# Patient Record
Sex: Female | Born: 2000 | Hispanic: Yes | Marital: Married | Smoking: Never smoker
Health system: Southern US, Community
[De-identification: ages and names within clinical notes are randomized; demographics above are authoritative.]

## PROBLEM LIST (undated history)

## (undated) DIAGNOSIS — J45909 Unspecified asthma, uncomplicated: Secondary | ICD-10-CM

## (undated) DIAGNOSIS — Z9109 Other allergy status, other than to drugs and biological substances: Secondary | ICD-10-CM

## (undated) DIAGNOSIS — F332 Major depressive disorder, recurrent severe without psychotic features: Secondary | ICD-10-CM

## (undated) HISTORY — PX: NO PAST SURGERIES: SHX2092

---
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
## 2020-01-19 ENCOUNTER — Other Ambulatory Visit: Payer: Self-pay

## 2020-01-19 ENCOUNTER — Ambulatory Visit (HOSPITAL_COMMUNITY)
Admission: EM | Admit: 2020-01-19 | Discharge: 2020-01-19 | Disposition: A | Discharge status: Home / Self Care | Payer: Medicaid Other | Attending: Family Medicine | Admitting: Family Medicine

## 2020-01-19 DIAGNOSIS — R42 Dizziness and giddiness: Secondary | ICD-10-CM | POA: Diagnosis not present

## 2020-01-19 LAB — COMPREHENSIVE METABOLIC PANEL
ALT: 37 U/L (ref 0–44)
AST: 36 U/L (ref 15–41)
Albumin: 3.7 g/dL (ref 3.5–5.0)
Alkaline Phosphatase: 64 U/L (ref 38–126)
Anion gap: 9 (ref 5–15)
BUN: 9 mg/dL (ref 6–20)
CO2: 27 mmol/L (ref 22–32)
Calcium: 9.1 mg/dL (ref 8.9–10.3)
Chloride: 101 mmol/L (ref 98–111)
Creatinine, Ser: 0.66 mg/dL (ref 0.44–1.00)
GFR, Estimated: >60 mL/min (ref >60)
Glucose, Bld: 101 mg/dL — ABNORMAL HIGH (ref 70–99)
Potassium: 4.1 mmol/L (ref 3.5–5.1)
Sodium: 137 mmol/L (ref 135–145)
Total Bilirubin: 0.9 mg/dL (ref 0.3–1.2)
Total Protein: 6.8 g/dL (ref 6.5–8.1)

## 2020-01-19 LAB — TSH: TSH: 2.522 u[IU]/mL (ref 0.350–4.500)

## 2020-01-19 LAB — POCT URINALYSIS DIPSTICK, ED / UC
Bilirubin Urine: NEGATIVE
Glucose, UA: NEGATIVE mg/dL
Hgb urine dipstick: NEGATIVE
Ketones, ur: NEGATIVE mg/dL
Leukocytes,Ua: NEGATIVE
Nitrite: NEGATIVE
Protein, ur: NEGATIVE mg/dL
Specific Gravity, Urine: 1.025 (ref 1.005–1.030)
Urobilinogen, UA: 0.2 mg/dL (ref 0.0–1.0)
pH: 7.5 (ref 5.0–8.0)

## 2020-01-19 LAB — CBC WITH DIFFERENTIAL/PLATELET
Abs Immature Granulocytes: 0 10*3/uL (ref 0.00–0.07)
Basophils Absolute: 0 10*3/uL (ref 0.0–0.1)
Basophils Relative: 0 %
Eosinophils Absolute: 0 10*3/uL (ref 0.0–0.5)
Eosinophils Relative: 0 %
HCT: 42.7 % (ref 36.0–46.0)
Hemoglobin: 14.2 g/dL (ref 12.0–15.0)
Lymphocytes Relative: 33 %
Lymphs Abs: 1.7 10*3/uL (ref 0.7–4.0)
MCH: 29.5 pg (ref 26.0–34.0)
MCHC: 33.3 g/dL (ref 30.0–36.0)
MCV: 88.8 fL (ref 80.0–100.0)
Monocytes Absolute: 0.5 10*3/uL (ref 0.1–1.0)
Monocytes Relative: 9 %
Neutro Abs: 2.9 10*3/uL (ref 1.7–7.7)
Neutrophils Relative %: 58 %
Platelets: 143 10*3/uL — ABNORMAL LOW (ref 150–400)
RBC: 4.81 MIL/uL (ref 3.87–5.11)
RDW: 12.4 % (ref 11.5–15.5)
WBC: 5 10*3/uL (ref 4.0–10.5)
nRBC: 0 % (ref 0.0–0.2)
nRBC: 0 /100 WBC

## 2020-01-19 LAB — POC URINE PREG, ED: Preg Test, Ur: NEGATIVE

## 2020-01-19 NOTE — ED Triage Notes (Signed)
Pt presents with ongoing dizziness, headache, and fatigue today.

## 2020-01-19 NOTE — Discharge Instructions (Signed)
Follow up with Primary Care as soon as able to re-check your symptoms. Make sure to drink plenty of fluids and eat a health diet

## 2020-01-20 NOTE — ED Provider Notes (Signed)
[facility]    CSN: [id number] Arrival date & time: 01/19/20  1834      History   Chief Complaint Chief Complaint  Patient presents with  . Dizziness  . Headache  . Fatigue    HPI Robyn Ball is a 19 y.o. female.   Here today with about 2 weeks of acute on chronic positional dizziness as well as some intermittent fatigue and headaches that seem to sometimes be related to the dizziness. She states she's been dealing with this issue for several years now and has seen Cardiology and PCP for it in the past without abnormal findings other than orthostasis. States when she bends down or gets up too quickly this seems to trigger the dizziness which lasts a few minutes. Sometimes does come with nausea but not every time. No room spinning, CP, SOB, palpitations, syncope. Does feel it may be partly related to her poor appetite and intake lately. She has not been hydrating or eating much as she states she feels full all the time. Does not try anything OTC to help with sxs.      Past Medical History:  Diagnosis Date  . Asthma   . Environmental allergies   . 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 Suicidal ideations 06/07/2015  . Acute respiratory failure, unspecified whether with hypoxia or hypercapnia (HCC) 04/19/2014  . Status asthmaticus 04/18/2014    No past surgical history on file.  OB History   No obstetric history on file.      Home Medications    Prior to Admission medications   Medication Sig Start Date End Date Taking? Authorizing Provider  albuterol (PROVENTIL HFA;VENTOLIN HFA) 108 (90 BASE) MCG/ACT inhaler Inhale 4 puffs into the lungs every 4 (four) hours as needed for wheezing or shortness of breath. 04/22/14   Ardith Dark, MD  albuterol (PROVENTIL  HFA;VENTOLIN HFA) 108 (90 Base) MCG/ACT inhaler Inhale 2 puffs into the lungs every 4 (four) hours as needed for wheezing or shortness of breath. 03/17/17   Little, Ambrose Finland, MD  beclomethasone (QVAR) 80 MCG/ACT inhaler Inhale 1 puff into the lungs 2 (two) times daily. Patient not taking: Reported on 04/26/2015 04/22/14   Ardith Dark, MD  ibuprofen (ADVIL,MOTRIN) 600 MG tablet Take 1 tablet (600 mg total) by mouth every 6 (six) hours as needed. 03/09/17   Ree Shay, MD  loratadine (CLARITIN) 10 MG tablet Take 1 tablet (10 mg total) by mouth daily. 04/27/15   Antony Madura, PA-C  ondansetron (ZOFRAN ODT) 4 MG disintegrating tablet Take 1 tablet (4 mg total) by mouth every 8 (eight) hours as needed for nausea or vomiting. 03/17/17   Little, Ambrose Finland, MD  oseltamivir (TAMIFLU) 75 MG capsule Take 1 capsule (75 mg total) by mouth every 12 (twelve) hours. 03/17/17   Little, Ambrose Finland, MD    Family History No family history on file.  Social History Social History   Tobacco Use  . Smoking status: Never Smoker  . Smokeless tobacco: Never Used  Substance Use Topics  . Alcohol use: No  . Drug use: No     Allergies   Patient has no known allergies.   Review of Systems Review of Systems PER HPI    Physical Exam Triage Vital Signs ED Triage Vitals  Enc Vitals Group  BP 01/19/20 1902 113/73     Pulse Rate 01/19/20 1902 82     Resp 01/19/20 1902 18     Temp 01/19/20 1902 99.2 F (37.3 C)     Temp Source 01/19/20 1902 Oral     SpO2 01/19/20 1902 100 %     Weight --      Height --      Head Circumference --      Peak Flow --      Pain Score 01/19/20 1900 4     Pain Loc --      Pain Edu? --      Excl. in GC? --    No data found.  Updated Vital Signs BP 113/73 (BP Location: Right Arm)   Pulse 82   Temp 99.2 F (37.3 C) (Oral)   Resp 18   LMP 12/01/2019 Comment: Pt states she took a plan b and that was her last normal period  SpO2 100%   Visual Acuity Right Eye  Distance:   Left Eye Distance:   Bilateral Distance:    Right Eye Near:   Left Eye Near:    Bilateral Near:     Physical Exam Vitals and nursing note reviewed.  Constitutional:      Appearance: Normal appearance. She is not ill-appearing.  HENT:     Head: Atraumatic.     Right Ear: Tympanic membrane normal.     Left Ear: Tympanic membrane normal.     Nose: Nose normal.     Mouth/Throat:     Mouth: Mucous membranes are moist.     Pharynx: Oropharynx is clear.  Eyes:     Extraocular Movements: Extraocular movements intact.     Conjunctiva/sclera: Conjunctivae normal.  Cardiovascular:     Rate and Rhythm: Normal rate and regular rhythm.     Heart sounds: Normal heart sounds.  Pulmonary:     Effort: Pulmonary effort is normal.     Breath sounds: Normal breath sounds. No wheezing or rales.  Abdominal:     General: Bowel sounds are normal. There is no distension.     Palpations: Abdomen is soft.     Tenderness: There is no abdominal tenderness. There is no guarding.  Musculoskeletal:        General: Normal range of motion.     Cervical back: Normal range of motion and neck supple.  Skin:    General: Skin is warm and dry.  Neurological:     General: No focal deficit present.     Mental Status: She is alert and oriented to person, place, and time.     Cranial Nerves: No cranial nerve deficit.     Sensory: No sensory deficit.     Motor: No weakness.     Gait: Gait normal.  Psychiatric:        Mood and Affect: Mood normal.        Thought Content: Thought content normal.        Judgment: Judgment normal.      UC Treatments / Results  Labs (all labs ordered are listed, but only abnormal results are displayed) Labs Reviewed  CBC WITH DIFFERENTIAL/PLATELET - Abnormal; Notable for the following components:      Result Value   Platelets 143 (*)    All other components within normal limits  COMPREHENSIVE METABOLIC PANEL - Abnormal; Notable for the following components:    Glucose, Bld 101 (*)    All other components within normal limits  TSH  POCT URINALYSIS DIPSTICK, ED / UC  POC URINE PREG, ED    EKG   Radiology No results found.  Procedures Procedures (including critical care time)  Medications Ordered in UC Medications - No data to display  Initial Impression / Assessment and Plan / UC Course  I have reviewed the triage vital signs and the nursing notes.  Pertinent labs & imaging results that were available during my care of the patient were reviewed by me and considered in my medical decision making (see chart for details).     U/A, urine preg, vitals, EKG (stable from previous done at Cardiology, sinus arrhythmia without any acute ST T wave changes) exam all benign and reassuring today. Will check some basic bloodwork to rule out thyroid condition, anemia, electrolyte abnormalities. ?POTS given her history and presentation and discussed close PCP f/u and possibly referral to Neurology for further evaluation. Discussed proper diet and hydration additionally. Strict return precautions reviewed.   Final Clinical Impressions(s) / UC Diagnoses   Final diagnoses:  Dizziness     Discharge Instructions     Follow up with Primary Care as soon as able to re-check your symptoms. Make sure to drink plenty of fluids and eat a health diet    ED Prescriptions    None     PDMP not reviewed this encounter.   Particia Nearing, [place] 01/20/20 1113

## 2021-01-02 ENCOUNTER — Inpatient Hospital Stay (HOSPITAL_COMMUNITY)
Admission: AD | Admit: 2021-01-02 | Discharge: 2021-01-02 | Disposition: A | Discharge status: Home / Self Care | Payer: Medicaid Other | Attending: Obstetrics and Gynecology | Admitting: Obstetrics and Gynecology

## 2021-01-02 ENCOUNTER — Other Ambulatory Visit: Payer: Self-pay

## 2021-01-02 ENCOUNTER — Encounter (HOSPITAL_COMMUNITY): Payer: Self-pay

## 2021-01-02 DIAGNOSIS — O26891 Other specified pregnancy related conditions, first trimester: Secondary | ICD-10-CM | POA: Diagnosis not present

## 2021-01-02 DIAGNOSIS — Z3A12 12 weeks gestation of pregnancy: Secondary | ICD-10-CM | POA: Insufficient documentation

## 2021-01-02 DIAGNOSIS — R109 Unspecified abdominal pain: Secondary | ICD-10-CM | POA: Insufficient documentation

## 2021-01-02 DIAGNOSIS — O26899 Other specified pregnancy related conditions, unspecified trimester: Secondary | ICD-10-CM

## 2021-01-02 LAB — URINALYSIS, ROUTINE W REFLEX MICROSCOPIC
Bilirubin Urine: NEGATIVE
Glucose, UA: NEGATIVE mg/dL
Hgb urine dipstick: NEGATIVE
Ketones, ur: 80 mg/dL — AB
Leukocytes,Ua: NEGATIVE
Nitrite: NEGATIVE
Protein, ur: NEGATIVE mg/dL
Specific Gravity, Urine: 1.028 (ref 1.005–1.030)
pH: 5 (ref 5.0–8.0)

## 2021-01-02 LAB — POC URINE PREG, ED: Preg Test, Ur: POSITIVE — AB

## 2021-01-02 NOTE — MAU Provider Note (Signed)
History     CSN: [id number]  Arrival date and time: 01/02/21 1247   Event Date/Time   First Provider Initiated Contact with Patient 01/02/21 1617      Chief Complaint  Patient presents with   Abdominal Pain   HPI Blyss Aguilar-Varela is a 20 y.o. G1P0 at [redacted]w[redacted]d who presents with abdominal pain. Symptoms started 2 days ago. Reports pain throughout the left side of her abdomen. Pain is intermittent & occurs several times per day. Lasts for about 5 minutes at a time. Rates pain 5/10. Hasn't treated symptoms. Endorses nausea. Denies fever, diarrhea, constipation, dysuria, hematuria, flank pain, vaginal bleeding, or vaginal discharge. Goes to [employer] - had her new ob appointment last week.   OB History     Gravida  1   Para      Term      Preterm      AB      Living         SAB      IAB      Ectopic      Multiple      Live Births              Past Medical History:  Diagnosis Date   Asthma    Environmental allergies    MDD (major depressive disorder), recurrent severe, without psychosis ([facility]) 06/08/2015    Past Surgical History:  Procedure Laterality Date   NO PAST SURGERIES      History reviewed. No pertinent family history.  Social History   Tobacco Use   Smoking status: Never   Smokeless tobacco: Never  Vaping Use   Vaping Use: Never used  Substance Use Topics   Alcohol use: No   Drug use: No    Allergies:  Allergies  Allergen Reactions   Pollen Extract     Medications Prior to Admission  Medication Sig Dispense Refill Last Dose   Prenatal Vit-Fe Fumarate-FA (PREPLUS) 27-1 MG TABS Take 1 tablet by mouth daily.   Past Week   promethazine (PHENERGAN) 25 MG tablet Take by mouth.      albuterol (PROVENTIL HFA;VENTOLIN HFA) 108 (90 BASE) MCG/ACT inhaler Inhale 4 puffs into the lungs every 4 (four) hours as needed for wheezing or shortness of breath. 2 Inhaler 2 Unknown   albuterol (PROVENTIL HFA;VENTOLIN HFA) 108 (90 Base) MCG/ACT  inhaler Inhale 2 puffs into the lungs every 4 (four) hours as needed for wheezing or shortness of breath. 1 Inhaler 0 Unknown    Review of Systems Physical Exam   Blood pressure 108/73, pulse 61, temperature 98 F (36.7 C), temperature source Oral, resp. rate 16, weight 66 kg, last menstrual period 09/10/2020, SpO2 98 %.  Physical Exam Vitals and nursing note reviewed. Exam conducted with a chaperone present.  Constitutional:      Appearance: She is well-developed. She is not ill-appearing.  HENT:     Head: Normocephalic and atraumatic.  Eyes:     General: No scleral icterus. Pulmonary:     Effort: Pulmonary effort is normal. No respiratory distress.  Abdominal:     General: Abdomen is flat.     Palpations: Abdomen is soft.     Tenderness: There is no abdominal tenderness. There is no right CVA tenderness or left CVA tenderness.  Genitourinary:    Comments: NEFG. No blood. Cervix closed/thick Skin:    General: Skin is warm and dry.  Neurological:     General: No focal deficit present.  Mental Status: She is alert.  Psychiatric:        Mood and Affect: Mood normal.        Behavior: Behavior normal.    MAU Course  Procedures Results for orders placed or performed during the hospital encounter of 01/02/21 (from the past 24 hour(s))  POC urine preg, ED (not at [facility])     Status: Abnormal   Collection Time: 01/02/21  2:30 PM  Result Value Ref Range   Preg Test, Ur POSITIVE (A) NEGATIVE  Urinalysis, Routine w reflex microscopic Urine, Clean Catch     Status: Abnormal   Collection Time: 01/02/21  3:17 PM  Result Value Ref Range   Color, Urine YELLOW YELLOW   APPearance HAZY (A) CLEAR   Specific Gravity, Urine 1.028 1.005 - 1.030   pH 5.0 5.0 - 8.0   Glucose, UA NEGATIVE NEGATIVE mg/dL   Hgb urine dipstick NEGATIVE NEGATIVE   Bilirubin Urine NEGATIVE NEGATIVE   Ketones, ur 80 (A) NEGATIVE mg/dL   Protein, ur NEGATIVE NEGATIVE mg/dL   Nitrite NEGATIVE NEGATIVE    Leukocytes,Ua NEGATIVE NEGATIVE    MDM Patient at [redacted]w[redacted]d who presents with left sided abdominal pain. Benign abdominal exam. Cervix closed/thick. No CVA tenderness. Pt afebrile. Currently in no pain. FHT present via doppler.   U/a shows no signs of infection.  Pt stable for discharge.  Assessment and Plan   1. Abdominal pain affecting pregnancy   2. [redacted] weeks gestation of pregnancy    -f/u with OB -reviewed reasons to return to MAU  Judeth Horn 01/02/2021, 5:38 PM

## 2021-01-02 NOTE — Discharge Instructions (Signed)
Return to care  If you have heavier bleeding that soaks through more than 2 pads per hour for an hour or more If you bleed so much that you feel like you might pass out or you do pass out If you have significant abdominal pain that is not improved with Tylenol   

## 2021-01-02 NOTE — ED Notes (Addendum)
Report called to [place], MAU Charge RN and transport notified of pt.

## 2021-01-02 NOTE — ED Triage Notes (Signed)
Patient is [redacted] weeks pregnant and had first OB visit last week. Complains of left sided abdominal pain with no associated symptoms x 3 days. Denies dysuria, denies bleeding, no discharge

## 2021-01-02 NOTE — ED Provider Notes (Signed)
Emergency Medicine Provider OB Triage Evaluation Note  Robyn Ball is a 20 y.o. female, No obstetric history on file., at Unknown gestation who presents to the emergency department with complaints of abd pain.  Review of  Systems  Positive: L side abd pain Negative: vaginal bleeding, vaginal discharge, fever  Physical Exam  BP 100/66 (BP Location: Left Arm)   Pulse 73   Temp 98.3 F (36.8 C)   Resp 16   SpO2 97%  General: Awake, no distress  HEENT: Atraumatic  Resp: Normal effort  Cardiac: Normal rate Abd: Nondistended, nontender  MSK: Moves all extremities without difficulty Neuro: Speech clear  Medical Decision Making  Pt evaluated for pregnancy concern and is stable for transfer to MAU. Pt is in agreement with plan for transfer.  2:27 PM Discussed with MAU APP, Rayfield Citizen, who accepts patient in transfer.  Clinical Impression  No diagnosis found.     Fayrene Helper, PA-C 01/02/21 1428    Terald Sleeper, MD 01/02/21 [phone]

## 2021-01-02 NOTE — MAU Note (Signed)
Pt reports to MAU with c/o abdominal pain on her left side that occurred this am.  Pt states that the pain has been off an on since about two nights ago.  Denies vaginal bleeding.

## 2021-02-06 DIAGNOSIS — Z9189 Other specified personal risk factors, not elsewhere classified: Secondary | ICD-10-CM

## 2021-02-24 ENCOUNTER — Other Ambulatory Visit: Payer: Self-pay | Admitting: Obstetrics and Gynecology

## 2021-02-24 DIAGNOSIS — Z3689 Encounter for other specified antenatal screening: Secondary | ICD-10-CM

## 2021-02-25 ENCOUNTER — Other Ambulatory Visit: Payer: Self-pay

## 2021-02-28 ENCOUNTER — Ambulatory Visit: Payer: Medicaid Other | Admitting: *Deleted

## 2021-02-28 ENCOUNTER — Ambulatory Visit: Payer: Medicaid Other | Attending: Obstetrics and Gynecology

## 2021-02-28 ENCOUNTER — Encounter: Payer: Self-pay | Admitting: *Deleted

## 2021-02-28 ENCOUNTER — Ambulatory Visit: Payer: Medicaid Other | Attending: Obstetrics and Gynecology | Admitting: Obstetrics and Gynecology

## 2021-02-28 ENCOUNTER — Other Ambulatory Visit: Payer: Self-pay | Admitting: Obstetrics and Gynecology

## 2021-02-28 ENCOUNTER — Other Ambulatory Visit: Payer: Self-pay

## 2021-02-28 VITALS — BP 101/56 | HR 71

## 2021-02-28 DIAGNOSIS — Z3689 Encounter for other specified antenatal screening: Secondary | ICD-10-CM

## 2021-02-28 DIAGNOSIS — O361120 Maternal care for Anti-A sensitization, second trimester, not applicable or unspecified: Secondary | ICD-10-CM

## 2021-02-28 DIAGNOSIS — Z363 Encounter for antenatal screening for malformations: Secondary | ICD-10-CM | POA: Diagnosis present

## 2021-02-28 DIAGNOSIS — Z3A20 20 weeks gestation of pregnancy: Secondary | ICD-10-CM | POA: Diagnosis not present

## 2021-02-28 NOTE — Progress Notes (Signed)
Maternal-Fetal Medicine   Name: Robyn Ball DOB: 2000-07-17 MRN: [id number] Referring Provider: Philip Aspen, MD  I had the pleasure of seeing [facility] at [facility]. She is G1 P0 at 20w 3d gestation and is here for a second-opinion ultrasound. At your office ultrasound performed on 02/24/21, bilateral pleural effusion (no hydrops) was seen.  On cell free fetal DNA screening, the risks of fetal aneuploidies are not increased.  MSAFP screening showed low risk for open neural tube defects. Patient does not give history of fever or rashes.  Past medical history: No history of diabetes or hypertension or any chronic medical conditions. She gives history of syphilis that was treated Past surgical history: Nil of note. Medications: Prenatal vitamins. Allergies: No known drug allergies. Social history: Denies tobacco or drug or alcohol use.  Family history: No history of venous thromboembolism in the family.  GYN history: No history of abnormal Pap smears or cervical surgeries.  Labs Blood type: O positive, RPR nonreactive, hemoglobin A1C 5%.  Ultrasound We performed fetal anatomical survey.  Fetal biometry is ahead of established gestational age by 1 week.  Amniotic fluid is normal good fetal activity seen. Findings include: -Bilateral large pleural effusions. -Minimal ascites is seen. -Scalp edema is present. Rest of the fetal anatomy including intracranial structures, kidneys, spine, bladder and skeletal survey appears normal. Cardiac anatomy could not be evaluated well.  However, cardiac axis and four-chamber view appear to be normal. Fetal heart rate and rhythm are normal. Placenta appears normal with no placentomegaly. Middle-cerebral artery Doppler showed normal peak-systolic velocity measurements (no evidence of fetal anemia). -Umbilical artery Doppler showed normal forward diastolic flow.  Our concerns include: Fetal hydrops -I  explained the diagnosis of fetal hydrops because of the presence of abnormal accumulation of fluid in serous cavities and the presence of scalp edema. -I discussed the possible causes including fetal hydrothorax, congenital heart disease (present in up to 20% of cases), fetal chromosomal malformations (Turner syndrome, Down syndrome, triploidy and trisomy 68), genetic syndromes, fetal infections (toxoplasmosis, CMV and Parvovirus).  -I explained the limitations of cell-free fetal DNA screening. -Other causes include chorioangioma (unlikely). MCA Doppler study is reassuring and rules out fetal anemia.  -Patient's blood type is O positive and antibody screen is negative. She is unlikely to have isoimmunization. -Fetomaternal hemorrhage can also lead to hydrops (no history of abdominal trauma). -Inborn errors of metabolism (1% to 2%) can lead to hydrops. -I recommended amniocentesis to rule out chromosomal anomalies and infections. Microarray analysis can be done to rule out some genetic conditions.  Amniotic fluid will be sent for CMV, toxoplasmosis and parvovirus PCR.  I explained the procedure and possible complication of miscarriage (1 and 500 procedures). I informed her that fetal loss is higher and is independent of the procedure performed. -Recommend fetal echocardiography by pediatric cardiologist.   Fetal hydrothorax -It can be primary, which is a defect in the lymphatic system (chylothorax postnatally) and is less common. It is a diagnosis of exclusion of other causes. -Severe pleural effusion can lead to pulmonary hypoplasia. -It is possible that hydrops complicated fetal hydrothorax.  Patient may benefit from thoracoamniotic shunt that should be considered in severe cases to reduce the likelihood of pulmonary hypoplasia and hydrops fetalis. Given the possibility of thoracoamniotic shunt placement, I recommended evaluation at the [facility]. Patient agreed to go to the [facility] for an  appointment.  I discussed with Dr. Barbarann Ehlers, who agreed to see her tomorrow or  in 2 days. MFM Division at the [facility] will contact your patient tomorrow with the appointment.  Recommendations -Amniocentesis/thoracentesis/shunt placement at [facility]. -Infectious work-up, chromosomal anomalies, microarray, Exome sequencing as needed. -We will follow-up as needed after evaluation at the [facility].  Thank you for consultation.  If you have any questions or concerns, please contact me the [facility].  Consultation including face-to-face (more than 50%) counseling 50 minutes.

## 2021-03-29 ENCOUNTER — Other Ambulatory Visit: Payer: Self-pay

## 2021-03-29 ENCOUNTER — Inpatient Hospital Stay (HOSPITAL_COMMUNITY)
Admission: EM | Admit: 2021-03-29 | Discharge: 2021-03-29 | Disposition: A | Discharge status: Home / Self Care | Payer: Medicaid Other | Attending: Obstetrics and Gynecology | Admitting: Obstetrics and Gynecology

## 2021-03-29 ENCOUNTER — Encounter (HOSPITAL_COMMUNITY): Payer: Self-pay | Admitting: *Deleted

## 2021-03-29 DIAGNOSIS — O26892 Other specified pregnancy related conditions, second trimester: Secondary | ICD-10-CM | POA: Insufficient documentation

## 2021-03-29 DIAGNOSIS — O99891 Other specified diseases and conditions complicating pregnancy: Secondary | ICD-10-CM

## 2021-03-29 DIAGNOSIS — Z20822 Contact with and (suspected) exposure to covid-19: Secondary | ICD-10-CM | POA: Diagnosis not present

## 2021-03-29 DIAGNOSIS — M549 Dorsalgia, unspecified: Secondary | ICD-10-CM | POA: Diagnosis not present

## 2021-03-29 DIAGNOSIS — R109 Unspecified abdominal pain: Secondary | ICD-10-CM | POA: Insufficient documentation

## 2021-03-29 DIAGNOSIS — M545 Low back pain, unspecified: Secondary | ICD-10-CM | POA: Diagnosis not present

## 2021-03-29 DIAGNOSIS — Y92410 Unspecified street and highway as the place of occurrence of the external cause: Secondary | ICD-10-CM | POA: Diagnosis not present

## 2021-03-29 DIAGNOSIS — Z3A24 24 weeks gestation of pregnancy: Secondary | ICD-10-CM | POA: Diagnosis not present

## 2021-03-29 DIAGNOSIS — R519 Headache, unspecified: Secondary | ICD-10-CM | POA: Diagnosis not present

## 2021-03-29 LAB — URINALYSIS, ROUTINE W REFLEX MICROSCOPIC
Bilirubin Urine: NEGATIVE
Glucose, UA: NEGATIVE mg/dL
Hgb urine dipstick: NEGATIVE
Ketones, ur: NEGATIVE mg/dL
Nitrite: NEGATIVE
Protein, ur: NEGATIVE mg/dL
Specific Gravity, Urine: 1.005 (ref 1.005–1.030)
pH: 7 (ref 5.0–8.0)

## 2021-03-29 LAB — CBC
HCT: 33.4 % — ABNORMAL LOW (ref 36.0–46.0)
Hemoglobin: 11.5 g/dL — ABNORMAL LOW (ref 12.0–15.0)
MCH: 31.7 pg (ref 26.0–34.0)
MCHC: 34.4 g/dL (ref 30.0–36.0)
MCV: 92 fL (ref 80.0–100.0)
Platelets: 222 10*3/uL (ref 150–400)
RBC: 3.63 MIL/uL — ABNORMAL LOW (ref 3.87–5.11)
RDW: 12.8 % (ref 11.5–15.5)
WBC: 11.2 10*3/uL — ABNORMAL HIGH (ref 4.0–10.5)
nRBC: 0 % (ref 0.0–0.2)

## 2021-03-29 LAB — COMPREHENSIVE METABOLIC PANEL
ALT: 14 U/L (ref 0–44)
AST: 28 U/L (ref 15–41)
Albumin: 3.1 g/dL — ABNORMAL LOW (ref 3.5–5.0)
Alkaline Phosphatase: 95 U/L (ref 38–126)
Anion gap: 11 (ref 5–15)
BUN: 5 mg/dL — ABNORMAL LOW (ref 6–20)
CO2: 19 mmol/L — ABNORMAL LOW (ref 22–32)
Calcium: 8.8 mg/dL — ABNORMAL LOW (ref 8.9–10.3)
Chloride: 105 mmol/L (ref 98–111)
Creatinine, Ser: 0.5 mg/dL (ref 0.44–1.00)
GFR, Estimated: >60 mL/min (ref >60)
Glucose, Bld: 96 mg/dL (ref 70–99)
Potassium: 3.6 mmol/L (ref 3.5–5.1)
Sodium: 135 mmol/L (ref 135–145)
Total Bilirubin: 0.8 mg/dL (ref 0.3–1.2)
Total Protein: 6.4 g/dL — ABNORMAL LOW (ref 6.5–8.1)

## 2021-03-29 LAB — PROTIME-INR
INR: 1 (ref 0.8–1.2)
Prothrombin Time: 12.7 seconds (ref 11.4–15.2)

## 2021-03-29 LAB — RESP PANEL BY RT-PCR (FLU A&B, COVID) ARPGX2
Influenza A by PCR: NEGATIVE
Influenza B by PCR: NEGATIVE
SARS Coronavirus 2 by RT PCR: NEGATIVE

## 2021-03-29 LAB — LACTIC ACID, PLASMA: Lactic Acid, Venous: 1.4 mmol/L (ref 0.5–1.9)

## 2021-03-29 MED ORDER — ACETAMINOPHEN 500 MG PO TABS
1000.0000 mg | ORAL_TABLET | Freq: Four times a day (QID) | ORAL | Status: DC | PRN
  Administered 2021-03-29: 1000 mg via ORAL
  Filled 2021-03-29: qty 2

## 2021-03-29 MED ORDER — ONDANSETRON HCL 4 MG/2ML IJ SOLN
4.0000 mg | Freq: Once | INTRAMUSCULAR | Status: AC
  Administered 2021-03-29: 4 mg via INTRAVENOUS
  Filled 2021-03-29: qty 2

## 2021-03-29 MED ORDER — CYCLOBENZAPRINE HCL 5 MG PO TABS
5.0000 mg | ORAL_TABLET | Freq: Three times a day (TID) | ORAL | 0 refills | Status: DC | PRN
Start: 2021-03-29 — End: 2023-06-07

## 2021-03-29 NOTE — ED Notes (Signed)
Report given to rapid OB RN, pt being transported to MAU via wheelchair

## 2021-03-29 NOTE — Progress Notes (Signed)
Report received from previous OB RR RN regarding patient status.  1911 Dr. Elly Modena contacted and made aware of patient in [place].  Will follow up with ED physician regarding medical clearance and contact Dr. Elly Modena back.  1912 Patient reports nausea, order received for zofran. Administered at 1923.  Fluids infusing at this time.  Patient endorses positive FM and audible on monitor. Abdomen soft and non-tender to touch.  Complaint of dull lower abdominal pain on left side.    Noelle.Marry  ED physician present at bedside. Notified that OB made aware.
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
## 2021-03-29 NOTE — Progress Notes (Signed)
Sharolyn Douglas, CNM notified of patient at 2011.  ED physician in contact with Dr. Elly Modena.   Tx of patient to MAU at 2101 via wheelchair without incidence.  Report given to MAU RN.
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
## 2021-04-21 ENCOUNTER — Other Ambulatory Visit: Payer: Self-pay

## 2021-04-21 ENCOUNTER — Inpatient Hospital Stay (HOSPITAL_COMMUNITY)
Admission: AD | Admit: 2021-04-21 | Discharge: 2021-04-21 | Disposition: A | Discharge status: Home / Self Care | Payer: Medicaid Other | Attending: Obstetrics and Gynecology | Admitting: Obstetrics and Gynecology

## 2021-04-21 ENCOUNTER — Encounter (HOSPITAL_COMMUNITY): Payer: Self-pay | Admitting: Obstetrics and Gynecology

## 2021-04-21 DIAGNOSIS — O368120 Decreased fetal movements, second trimester, not applicable or unspecified: Secondary | ICD-10-CM | POA: Diagnosis not present

## 2021-04-21 DIAGNOSIS — Z3689 Encounter for other specified antenatal screening: Secondary | ICD-10-CM | POA: Insufficient documentation

## 2021-04-21 DIAGNOSIS — M545 Low back pain, unspecified: Secondary | ICD-10-CM

## 2021-04-21 DIAGNOSIS — O26892 Other specified pregnancy related conditions, second trimester: Secondary | ICD-10-CM | POA: Insufficient documentation

## 2021-04-21 DIAGNOSIS — Z3A27 27 weeks gestation of pregnancy: Secondary | ICD-10-CM

## 2021-04-21 LAB — URINALYSIS, ROUTINE W REFLEX MICROSCOPIC
Bilirubin Urine: NEGATIVE
Glucose, UA: 50 mg/dL — AB
Hgb urine dipstick: NEGATIVE
Ketones, ur: NEGATIVE mg/dL
Nitrite: NEGATIVE
Protein, ur: NEGATIVE mg/dL
Specific Gravity, Urine: 1.021 (ref 1.005–1.030)
pH: 6 (ref 5.0–8.0)

## 2021-04-21 NOTE — MAU Provider Note (Signed)
?History  ?  ? ?CSN: [id number] ? ?Arrival date and time: 04/21/21 1233 ? ? Event Date/Time  ? First Provider Initiated Contact with Patient 04/21/21 1300   ?  ? ?Chief Complaint  ?Patient presents with  ? Back Pain  ? Decreased Fetal Movement  ? ?21 year old G1, P0 at 27.6 weeks presenting for decreased fetal movement and lower back pain.  Reports onset this a.m. around 7.  States back pain is midline and lumbar.  Rates pain 3 out of 10.  Has not treated. 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  Denies vaginal bleeding or loss of fluid. ? ? ?OB History   ? ? Gravida  ?1  ? Para  ?   ? Term  ?   ? Preterm  ?   ? AB  ?   ? Living  ?   ?  ? ? SAB  ?   ? IAB  ?   ? Ectopic  ?   ? Multiple  ?   ? Live Births  ?   ?   ?  ?  ? ? ?Past Medical History:  ?Diagnosis Date  ? Asthma   ? Environmental allergies   ? 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 ? ?Past Surgical History:  ?Procedure Laterality Date  ? NO PAST SURGERIES    ? ? ?Family History  ?Problem Relation Age of Onset  ? Diabetes Father   ? ? ?Social History  ? ?Tobacco Use  ? Smoking status: Never  ? Smokeless tobacco: Never  ?Vaping Use  ? Vaping Use: Never used  ?Substance Use Topics  ? Alcohol use: No  ? Drug use: No  ? ? ?Allergies:  ?Allergies  ?Allergen Reactions  ? Pollen Extract   ? ? ?Medications Prior to Admission  ?Medication Sig Dispense Refill Last Dose  ? Prenatal Vit-Fe Fumarate-FA (PREPLUS) 27-1 MG TABS Take 1 tablet by mouth daily.   04/20/2021  ? albuterol (PROVENTIL HFA;VENTOLIN HFA) 108 (90 BASE) MCG/ACT inhaler Inhale 4 puffs into the lungs every 4 (four) hours as needed for wheezing or shortness of breath. 2 Inhaler 2   ? albuterol (PROVENTIL HFA;VENTOLIN HFA) 108 (90 Base) MCG/ACT inhaler Inhale 2 puffs into the lungs every 4 (four) hours as needed for wheezing or shortness of breath. 1 Inhaler 0    ? cyclobenzaprine (FLEXERIL) 5 MG tablet Take 1 tablet (5 mg total) by mouth 3 (three) times daily as needed for muscle spasms. 30 tablet 0   ? promethazine (PHENERGAN) 25 MG tablet Take by mouth.     ? ? ?Review of Systems  ?Constitutional:  Negative for fever.  ?Gastrointestinal:  Negative for abdominal pain.  ?Genitourinary:  Negative for vaginal bleeding and vaginal discharge.  ?Musculoskeletal:  Positive for back pain.  ?Physical Exam  ? ?Blood pressure 107/62, pulse 87, temperature 97.9 ?F (36.6 ?C), temperature source Oral, resp. rate 14, last menstrual period 09/10/2020, SpO2 99 %. ? ?Physical Exam ?Vitals and nursing note reviewed.  ?Constitutional:   ?   General: She is not in acute distress. ?   Appearance: Normal appearance.  ?HENT:  ?   Head: Normocephalic and atraumatic.  ?Pulmonary:  ?   Effort: Pulmonary effort is normal. No respiratory distress.  ?Abdominal:  ?   Palpations: Abdomen is soft.  ?   Tenderness: There is no abdominal tenderness.  ?  Comments: Gravid ?  ?Musculoskeletal:     ?   General: Normal range of motion.  ?   Cervical back: Normal and normal range of motion.  ?   Thoracic back: Normal.  ?   Lumbar back: Normal.  ?     Back: ? ?Skin: ?   General: Skin is warm and dry.  ?Neurological:  ?   General: No focal deficit present.  ?   Mental Status: She is alert and oriented to person, place, and time.  ?Psychiatric:     ?   Mood and Affect: Mood normal.     ?   Behavior: Behavior normal.  ?EFM: 140 bpm, mod variability, + accels, rare variable decels ?Toco: rare UI ? ?Results for orders placed or performed during the hospital encounter of 04/21/21 (from the past 24 hour(s))  ?Urinalysis, Routine w reflex microscopic Urine, Clean Catch     Status: Abnormal  ? Collection Time: 04/21/21 12:42 PM  ?Result Value Ref Range  ? Color, Urine YELLOW YELLOW  ? APPearance HAZY (A) CLEAR  ? Specific Gravity, Urine 1.021 1.005 - 1.030  ? pH 6.0 5.0 - 8.0  ? Glucose, UA 50 (A) NEGATIVE mg/dL  ? Hgb  urine dipstick NEGATIVE NEGATIVE  ? Bilirubin Urine NEGATIVE NEGATIVE  ? Ketones, ur NEGATIVE NEGATIVE mg/dL  ? Protein, ur NEGATIVE NEGATIVE mg/dL  ? Nitrite NEGATIVE NEGATIVE  ? Leukocytes,Ua TRACE (A) NEGATIVE  ? RBC / HPF 0-5 0 - 5 RBC/hpf  ? WBC, UA 0-5 0 - 5 WBC/hpf  ? Bacteria, UA RARE (A) NONE SEEN  ? Squamous Epithelial / LPF 11-20 0 - 5  ? Mucus PRESENT   ? ? ?MAU Course  ?Procedures ? ?MDM ?NST reactive.  Marked greater than 20 fetal movements in the first hour.  Patient reports increased fetal activity.  Back pain greatly improved with heat packs.  Patient reassured and stable for discharge home. ? ?Assessment and Plan  ? ?1. [redacted] weeks gestation of pregnancy   ?2. NST (non-stress test) reactive   ?3. Acute midline low back pain without sciatica   ? ?Discharge home ?Follow-up at [facility] with Dr. Leward Quan ?Return precautions ?Tylenol and heat packs as needed ?Fetal movement counts ? ?Allergies as of 04/21/2021   ? ?   Reactions  ? Pollen Extract   ? ?  ? ?  ?Medication List  ?  ? ?TAKE these medications   ? ?albuterol 108 (90 Base) MCG/ACT inhaler ?Commonly known as: VENTOLIN HFA ?Inhale 4 puffs into the lungs every 4 (four) hours as needed for wheezing or shortness of breath. ?  ?albuterol 108 (90 Base) MCG/ACT inhaler ?Commonly known as: VENTOLIN HFA ?Inhale 2 puffs into the lungs every 4 (four) hours as needed for wheezing or shortness of breath. ?  ?cyclobenzaprine 5 MG tablet ?Commonly known as: FLEXERIL ?Take 1 tablet (5 mg total) by mouth 3 (three) times daily as needed for muscle spasms. ?  ?PrePLUS 27-1 MG Tabs ?Take 1 tablet by mouth daily. ?  ?promethazine 25 MG tablet ?Commonly known as: PHENERGAN ?Take by mouth. ?  ? ?  ? ?Julianne Handler, CNM ?04/21/2021, 2:28 PM  ?

## 2021-04-21 NOTE — MAU Note (Signed)
...  Robyn Ball is a 21 y.o. at [redacted]w[redacted]d here in MAU reporting: DFM since 0700 this morning. Patient states the last time she felt any movement was one hour ago. She states she had surgery yesterday with [facility]. 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 She is also endorsing back pain where she received her epidural that she rates a 7/10. She also endorses a HA since this morning that she rates a 4/10. ? ?Patient received repeat BMZ yesterday.  ? ?Pain score:  ?4/10 HA ?7/10 back ? ?FHT: 145 initial external ?Lab orders placed from triage: UA ? ?

## 2021-05-04 ENCOUNTER — Inpatient Hospital Stay (HOSPITAL_COMMUNITY)
Admission: AD | Admit: 2021-05-04 | Discharge: 2021-05-04 | Disposition: A | Discharge status: Home / Self Care | Payer: Medicaid Other | Attending: Obstetrics and Gynecology | Admitting: Obstetrics and Gynecology

## 2021-05-04 ENCOUNTER — Encounter (HOSPITAL_COMMUNITY): Payer: Self-pay | Admitting: Obstetrics and Gynecology

## 2021-05-04 ENCOUNTER — Other Ambulatory Visit: Payer: Self-pay

## 2021-05-04 DIAGNOSIS — Z3689 Encounter for other specified antenatal screening: Secondary | ICD-10-CM | POA: Insufficient documentation

## 2021-05-04 DIAGNOSIS — O99613 Diseases of the digestive system complicating pregnancy, third trimester: Secondary | ICD-10-CM | POA: Insufficient documentation

## 2021-05-04 DIAGNOSIS — R0602 Shortness of breath: Secondary | ICD-10-CM | POA: Insufficient documentation

## 2021-05-04 DIAGNOSIS — Z3A29 29 weeks gestation of pregnancy: Secondary | ICD-10-CM | POA: Diagnosis not present

## 2021-05-04 DIAGNOSIS — R000 Tachycardia, unspecified: Secondary | ICD-10-CM | POA: Diagnosis not present

## 2021-05-04 DIAGNOSIS — K5900 Constipation, unspecified: Secondary | ICD-10-CM | POA: Insufficient documentation

## 2021-05-04 DIAGNOSIS — O26893 Other specified pregnancy related conditions, third trimester: Secondary | ICD-10-CM | POA: Diagnosis present

## 2021-05-04 DIAGNOSIS — R42 Dizziness and giddiness: Secondary | ICD-10-CM | POA: Diagnosis not present

## 2021-05-04 LAB — CBC WITH DIFFERENTIAL/PLATELET
Abs Immature Granulocytes: 0.11 10*3/uL — ABNORMAL HIGH (ref 0.00–0.07)
Basophils Absolute: 0 10*3/uL (ref 0.0–0.1)
Basophils Relative: 0 %
Eosinophils Absolute: 0.1 10*3/uL (ref 0.0–0.5)
Eosinophils Relative: 1 %
HCT: 30.3 % — ABNORMAL LOW (ref 36.0–46.0)
Hemoglobin: 9.6 g/dL — ABNORMAL LOW (ref 12.0–15.0)
Immature Granulocytes: 1 %
Lymphocytes Relative: 21 %
Lymphs Abs: 2.1 10*3/uL (ref 0.7–4.0)
MCH: 29.1 pg (ref 26.0–34.0)
MCHC: 31.7 g/dL (ref 30.0–36.0)
MCV: 91.8 fL (ref 80.0–100.0)
Monocytes Absolute: 0.9 10*3/uL (ref 0.1–1.0)
Monocytes Relative: 9 %
Neutro Abs: 6.6 10*3/uL (ref 1.7–7.7)
Neutrophils Relative %: 68 %
Platelets: 187 10*3/uL (ref 150–400)
RBC: 3.3 MIL/uL — ABNORMAL LOW (ref 3.87–5.11)
RDW: 13.3 % (ref 11.5–15.5)
WBC: 9.9 10*3/uL (ref 4.0–10.5)
nRBC: 0 % (ref 0.0–0.2)

## 2021-05-04 LAB — URINALYSIS, ROUTINE W REFLEX MICROSCOPIC
Bilirubin Urine: NEGATIVE
Glucose, UA: NEGATIVE mg/dL
Hgb urine dipstick: NEGATIVE
Ketones, ur: NEGATIVE mg/dL
Leukocytes,Ua: NEGATIVE
Nitrite: NEGATIVE
Protein, ur: NEGATIVE mg/dL
Specific Gravity, Urine: 1.015 (ref 1.005–1.030)
pH: 7.5 (ref 5.0–8.0)

## 2021-05-04 LAB — BASIC METABOLIC PANEL
Anion gap: 7 (ref 5–15)
BUN: 5 mg/dL — ABNORMAL LOW (ref 6–20)
CO2: 23 mmol/L (ref 22–32)
Calcium: 8.8 mg/dL — ABNORMAL LOW (ref 8.9–10.3)
Chloride: 105 mmol/L (ref 98–111)
Creatinine, Ser: 0.48 mg/dL (ref 0.44–1.00)
GFR, Estimated: >60 mL/min (ref >60)
Glucose, Bld: 88 mg/dL (ref 70–99)
Potassium: 3.7 mmol/L (ref 3.5–5.1)
Sodium: 135 mmol/L (ref 135–145)

## 2021-05-04 LAB — GLUCOSE, CAPILLARY: Glucose-Capillary: 78 mg/dL (ref 70–99)

## 2021-05-04 NOTE — MAU Provider Note (Signed)
?History  ?  ? ?CSN: [id number] ? ?Arrival date and time: 05/04/21 [id number] ? ? Event Date/Time  ? First Provider Initiated Contact with Patient 05/04/21 0900   ?  ? ?Chief Complaint  ?Patient presents with  ? Dizziness  ? Shortness of Breath  ? Tachycardia  ? ?Robyn Ball is a 21 y.o. G1P0 at [redacted]w[redacted]d who receives care at [facility].  She presents today for Dizziness, Shortness of Breath, and Tachycardia. Patient states she has been having intermittent symptoms for the past week.  She reports she was seen, at [facility], for an amniocentesis and symptoms arise resulting in overnight stay.  She states she symptoms reappeared last night around 5pm and resolved only to return this morning around 5am.  She reports she initially experiences increased heart rate, followed by SOB, lightheadedness, and dizziness.  She states the symptoms resolve without interventions and she denies current symptoms.  Patient endorses fetal movement and denies abdominal cramping or contractions as well as vaginal concerns. ? ? ?OB History   ? ? Gravida  ?1  ? Para  ?   ? Term  ?   ? Preterm  ?   ? AB  ?   ? Living  ?   ?  ? ? SAB  ?   ? IAB  ?   ? Ectopic  ?   ? Multiple  ?   ? Live Births  ?   ?   ?  ?  ? ? ?Past Medical History:  ?Diagnosis Date  ? Asthma   ? Environmental allergies   ? 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 ? ?Past Surgical History:  ?Procedure Laterality Date  ? NO PAST SURGERIES    ? ? ?Family History  ?Problem Relation Age of Onset  ? Diabetes Father   ? ? ?Social History  ? ?Tobacco Use  ? Smoking status: Never  ? Smokeless tobacco: Never  ?Vaping Use  ? Vaping Use: Never used  ?Substance Use Topics  ? Alcohol use: No  ? Drug use: No  ? ? ?Allergies:  ?Allergies  ?Allergen Reactions  ? Pollen Extract   ? ? ?Medications Prior to Admission  ?Medication Sig Dispense Refill Last Dose  ? Prenatal Vit-Fe Fumarate-FA (PREPLUS) 27-1 MG TABS Take 1 tablet by mouth daily.    05/03/2021  ? albuterol (PROVENTIL HFA;VENTOLIN HFA) 108 (90 BASE) MCG/ACT inhaler Inhale 4 puffs into the lungs every 4 (four) hours as needed for wheezing or shortness of breath. 2 Inhaler 2   ? albuterol (PROVENTIL HFA;VENTOLIN HFA) 108 (90 Base) MCG/ACT inhaler Inhale 2 puffs into the lungs every 4 (four) hours as needed for wheezing or shortness of breath. 1 Inhaler 0   ? cyclobenzaprine (FLEXERIL) 5 MG tablet Take 1 tablet (5 mg total) by mouth 3 (three) times daily as needed for muscle spasms. 30 tablet 0   ? promethazine (PHENERGAN) 25 MG tablet Take by mouth.     ? ? ?Review of Systems  ?Eyes:  Negative for visual disturbance.  ?Respiratory:  Positive for shortness of breath. Negative for cough and chest tightness.   ?Cardiovascular:  Negative for chest pain.  ?Gastrointestinal:  Positive for constipation. Negative for abdominal pain, diarrhea, nausea and vomiting.  ?Genitourinary:  Negative for difficulty urinating, dysuria, vaginal bleeding and vaginal discharge.  ?Neurological:  Positive for dizziness and light-headedness. Negative for headaches.  ?Physical Exam  ? ?Blood pressure 106/61, pulse 75, temperature 98.4 ?F (36.9 ?  C), temperature source Oral, resp. rate 16, height 5\' 2"  (1.575 m), weight 80.9 kg, last menstrual period 09/10/2020, SpO2 100 %. ? ?Physical Exam ?Vitals reviewed.  ?Constitutional:   ?   Appearance: She is well-developed.  ?HENT:  ?   Head: Normocephalic and atraumatic.  ?Eyes:  ?   Conjunctiva/sclera: Conjunctivae normal.  ?Cardiovascular:  ?   Rate and Rhythm: Normal rate and regular rhythm.  ?   Heart sounds: Normal heart sounds.  ?Pulmonary:  ?   Effort: Pulmonary effort is normal. No tachypnea or accessory muscle usage.  ?   Breath sounds: Normal breath sounds. No decreased breath sounds.  ?Chest:  ?   Chest wall: No tenderness.  ?Abdominal:  ?   General: Bowel sounds are normal.  ?   Tenderness: There is no abdominal tenderness.  ?   Comments: Gravid, Appears AGA   ?Musculoskeletal:     ?   General: Normal range of motion.  ?   Cervical back: Normal range of motion.  ?   Right lower leg: No edema.  ?   Left lower leg: No edema.  ?Skin: ?   General: Skin is dry.  ?Neurological:  ?   Mental Status: She is alert and oriented to person, place, and time.  ?Psychiatric:     ?   Mood and Affect: Mood normal.     ?   Behavior: Behavior normal.  ? ? ?Fetal Assessment ?125 bpm, Mod Var, -Decels, -Accels ?Toco: Irregular ? ?MAU Course  ? ?Results for orders placed or performed during the hospital encounter of 05/04/21 (from the past 24 hour(s))  ?Urinalysis, Routine w reflex microscopic Urine, Clean Catch     Status: None  ? Collection Time: 05/04/21  9:00 AM  ?Result Value Ref Range  ? Color, Urine YELLOW YELLOW  ? APPearance CLEAR CLEAR  ? Specific Gravity, Urine 1.015 1.005 - 1.030  ? pH 7.5 5.0 - 8.0  ? Glucose, UA NEGATIVE NEGATIVE mg/dL  ? Hgb urine dipstick NEGATIVE NEGATIVE  ? Bilirubin Urine NEGATIVE NEGATIVE  ? Ketones, ur NEGATIVE NEGATIVE mg/dL  ? Protein, ur NEGATIVE NEGATIVE mg/dL  ? Nitrite NEGATIVE NEGATIVE  ? Leukocytes,Ua NEGATIVE NEGATIVE  ?Glucose, capillary     Status: None  ? Collection Time: 05/04/21  9:06 AM  ?Result Value Ref Range  ? Glucose-Capillary 78 70 - 99 mg/dL  ?Basic metabolic panel     Status: Abnormal  ? Collection Time: 05/04/21  9:15 AM  ?Result Value Ref Range  ? Sodium 135 135 - 145 mmol/L  ? Potassium 3.7 3.5 - 5.1 mmol/L  ? Chloride 105 98 - 111 mmol/L  ? CO2 23 22 - 32 mmol/L  ? Glucose, Bld 88 70 - 99 mg/dL  ? BUN 5 (L) 6 - 20 mg/dL  ? Creatinine, Ser 0.48 0.44 - 1.00 mg/dL  ? Calcium 8.8 (L) 8.9 - 10.3 mg/dL  ? GFR, Estimated >60 >60 mL/min  ? Anion gap 7 5 - 15  ?CBC with Differential/Platelet     Status: Abnormal  ? Collection Time: 05/04/21  9:15 AM  ?Result Value Ref Range  ? WBC 9.9 4.0 - 10.5 K/uL  ? RBC 3.30 (L) 3.87 - 5.11 MIL/uL  ? Hemoglobin 9.6 (L) 12.0 - 15.0 g/dL  ? HCT 30.3 (L) 36.0 - 46.0 %  ? MCV 91.8 80.0 - 100.0 fL  ? MCH  29.1 26.0 - 34.0 pg  ? MCHC 31.7 30.0 - 36.0 g/dL  ? RDW 13.3  11.5 - 15.5 %  ? Platelets 187 150 - 400 K/uL  ? nRBC 0.0 0.0 - 0.2 %  ? Neutrophils Relative % 68 %  ? Neutro Abs 6.6 1.7 - 7.7 K/uL  ? Lymphocytes Relative 21 %  ? Lymphs Abs 2.1 0.7 - 4.0 K/uL  ? Monocytes Relative 9 %  ? Monocytes Absolute 0.9 0.1 - 1.0 K/uL  ? Eosinophils Relative 1 %  ? Eosinophils Absolute 0.1 0.0 - 0.5 K/uL  ? Basophils Relative 0 %  ? Basophils Absolute 0.0 0.0 - 0.1 K/uL  ? Immature Granulocytes 1 %  ? Abs Immature Granulocytes 0.11 (H) 0.00 - 0.07 K/uL  ? ?No results found. ? ?MDM ?PE ?Labs: BMP, CBC, CBG ?EFM ?Orthostatic BP ?Referral ?Assessment and Plan  ?21 year old G1P0  ?SIUP at 29.5 weeks ?Cat I FT ?Tachycardia ?SOB ?Dizziness ?Lightheadedness ? ?-POC Reviewed ?-Exam performed. ?-CBG collected and 78. Patient denies eating this morning.  ?-EKG ordered and reviewed by C. Gwenlyn Perking, MD who reports normal. ?-Labs ordered and pending ?-NST Reactive. ?-Discussed referral for cardio-ob for further evaluation of symptoms and possible utilization of [place] monitor as appropriate. ?-Despite care in [facility], patient reports she lives in [place] and okay with referral for this area. ?-Informed that if labs return unchanged will discharge with follow up as appropriate.  ? ?Maryann Conners MSN, CNM ?05/04/2021, 9:00 AM  ? ?Reassessment (10:24 AM) ?Vitals:  ? 05/04/21 1028 05/04/21 1029 05/04/21 1031 05/04/21 1035  ?BP: 107/60 101/62 (!) 88/52 (!) 94/57  ?Pulse: 67 68 80 87  ?Resp:      ?Temp:      ?TempSrc:      ?SpO2:   98%   ?Weight:      ?Height:      ? ? ?-Labs as above. ?-No significant change from those on 3/22. ?-Orthostatic vitals performed and some hypotension noted from sitting to standing. ?-Discussed findings and educated on ways to avoid bouts of dizziness/LH with position changes. ?-Referral for Cardio OB placed. ?-Encouraged to call primary office or return to MAU if symptoms worsen or with the onset of new  symptoms. ?-Discharged to home in stable condition. ? ?Maryann Conners MSN, CNM ?Energy manager, [facility] for [employer] ? ? ? ?

## 2021-05-04 NOTE — MAU Note (Signed)
Robyn Ball is a 21 y.o. at [redacted]w[redacted]d here in MAU reporting: woke up at 5 to use the restroom, heart rate started getting faster, again happened when got up at 6, started feeling SOB and dizzy.  Last wk on Tues, this happened; pulse went up, vision "went away" - every thing went white and she couldn't hear.  Lasted less than 20 min. On Wed, had a procedure done for the baby, they noted that there were times when her pulse rate would go up, they kept her overnight to monitor it.  ?Doesn't feel her heart racing currently, but still feels dizzy and light headed. Feels SOB. ?Onset of complaint: last Tues ?(Pulse in triage, 75-90, o2sat 99/100, no apparent distress) ?Pain score: none ?Vitals:  ? 05/04/21 0838 05/04/21 0840  ?BP:  106/61  ?Pulse:  75  ?Resp:  16  ?Temp:  98.4 ?F (36.9 ?C)  ?SpO2: 100% 100%  ?   ?FHT:140 ?Lab orders placed from triage:  ED-EKG ? ?

## 2021-05-11 NOTE — Progress Notes (Signed)
?[facility] ? ?New Evaluation ? ?Date:  05/13/2021  ? ?ID:  Robyn Ball, DOB 24-Apr-2000, MRN [id number] ? ?PCP:  Ignacia Felling, MD ?  ?[facility] Providers ?Cardiologist:  None  ?Electrophysiologist:  None      ? ?Referring MD: Ignacia Felling, MD  ? ?Chief Complaint: dizziness, tachycardia ? ?History of Present Illness:   ? ?Robyn Ball is a 21 y.o. female [G1P0] who is being seen today for the evaluation of dizziness and tachycardia at the request of Ignacia Felling, MD.  ? ?Patient seen in MAU on 05/04/21 for dizziness, SOB and tachycardia that had been ongoing for 1 week. Note reviewed. ECG NSR with no ischemic changes. Was referred to cardio-OB for further management. ? ?Today, the patient is 31 weeks pregnancy. She states that about 1 month ago, she had an episode where she started to feel warm, very fatigued with changes in vision and hearing loss which lasted about 10-74min before resolving. Had a similar episode following amniocentesis and then had another episode when she presented to the MAU. No syncope but felt presyncopal at that time and was told her blood pressure was low. No associated chest pain, LE edema, orthopnea, or PND. Has some mild dyspnea. ? ?Otherwise, her pregnancy has been uncomplicated. No gestational hypertension, diabetes or pre-eclampsia.  ? ? ?Prior CV Studies Reviewed: ?The following studies were reviewed today: ?No CV studies ? ?Past Medical History:  ?Diagnosis Date  ? Asthma   ? Environmental allergies   ? 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 ? ?Past Surgical History:  ?Procedure Laterality Date  ? NO PAST SURGERIES    ?   ? ?OB History   ? ? Gravida  ?1  ? Para  ?   ? Term  ?   ? Preterm  ?   ? AB  ?   ? Living  ?   ?  ? ? SAB  ?   ? IAB  ?   ? Ectopic  ?   ? Multiple  ?   ? Live Births  ?   ?   ?  ?  ?    ? ? ?Current Medications: ?Current Meds  ?Medication Sig  ? Prenatal  Vit-Fe Fumarate-FA (PREPLUS) 27-1 MG TABS Take 1 tablet by mouth daily.  ?  ? ?Allergies:   Pollen extract  ? ?Social History  ? ?Socioeconomic History  ? Marital status: Single  ?  Spouse name: Not on file  ? Number of children: Not on file  ? Years of education: Not on file  ? Highest education level: Not on file  ?Occupational History  ? Not on file  ?Tobacco Use  ? Smoking status: Never  ? Smokeless tobacco: Never  ?Vaping Use  ? Vaping Use: Never used  ?Substance and Sexual Activity  ? Alcohol use: No  ? Drug use: No  ? Sexual activity: Yes  ?  Birth control/protection: None  ?Other Topics Concern  ? Not on file  ?Social History Narrative  ? Not on file  ? ?Social Determinants of Health  ? ?Financial Resource Strain: Not on file  ?Food Insecurity: Not on file  ?Transportation Needs: Not on file  ?Physical Activity: Not on file  ?Stress: Not on file  ?Social Connections: Not on file  ?  ? ? ?Family History  ?Problem Relation Age of Onset  ? Diabetes Father   ?   ? ?ROS:   ?Please  see the history of present illness.    ?Review of Systems  ?Constitutional:  Positive for malaise/fatigue.  ?Respiratory:  Negative for shortness of breath.   ?Cardiovascular:  Negative for chest pain, palpitations, orthopnea, claudication, leg swelling and PND.  ?Gastrointestinal:  Negative for blood in stool.  ?Genitourinary:  Negative for hematuria.  ?Neurological:  Positive for dizziness. Negative for loss of consciousness.   ? ? ?Labs/EKG Reviewed:   ? ?EKG:   ?EKG shows NSR with no ischemic changes ? ?Recent Labs: ?03/29/2021: ALT 14 ?05/04/2021: BUN 5; Creatinine, Ser 0.48; Hemoglobin 9.6; Platelets 187; Potassium 3.7; Sodium 135  ? ?Recent Lipid Panel ?No results found for: CHOL, TRIG, HDL, CHOLHDL, LDLCALC, LDLDIRECT ? ?Physical Exam:   ? ?VS:  BP 116/70   Pulse 86   Ht 5\' 2"  (1.575 m)   Wt 179 lb (81.2 kg)   LMP 09/10/2020 (Exact Date)   SpO2 97%   BMI 32.74 kg/m?    ? ?Wt Readings from Last 3 Encounters:  ?05/13/21 179 lb  (81.2 kg)  ?05/04/21 178 lb 6.4 oz (80.9 kg)  ?03/29/21 164 lb (74.4 kg)  ?  ? ?GEN:  Well nourished, well developed in no acute distress ?HEENT: Normal ?NECK: No JVD; No carotid bruits ?CARDIAC: RRR, soft systolic murmur. No rubs, gallops ?RESPIRATORY:  Clear to auscultation without rales, wheezing or rhonchi  ?ABDOMEN: Soft, non-tender, non-distended ?MUSCULOSKELETAL:  No edema; No deformity  ?SKIN: Warm and dry ?NEUROLOGIC:  Alert and oriented x 3 ?PSYCHIATRIC:  Normal affect  ? ? ? ?ASSESSMENT & PLAN:   ? ? ?#Vasovagal Symptoms: ?Patient presents with episodes of feeling fatigued, diaphoretic, lightheaded, generalized malaise with visual changes most consistent with vasovagal symptoms. Fortunately, no episodes of syncope. Discussed how these symptoms are common in pregnancy but can lead to LOC. Discussed the importance of adequate hydration, small frequent meals, compression socks, and avoiding triggers such as heat. If symptoms progress, can consider further monitoring with monitor at that time. Will check back in to see how she is feeling in a couple of weeks. ?-Increase hydration including electrolyte repletion ?-Compression socks ?-Small, frequent meals ?-Avoid triggers such as heat ?-Slow position changes ?-Elevate feet when able ? ?Patient Instructions  ?Medication Instructions:  ? ?Your physician recommends that you continue on your current medications as directed. Please refer to the Current Medication list given to you today. ? ?*If you need a refill on your cardiac medications before your next appointment, please call your pharmacy* ? ? ?Follow-Up: ? ?[place] ? ? ?Other Instructions ? ?Hipotensi?n ortost?tica ?Orthostatic Hypotension ?La presi?n arterial mide con cu?nta fuerza, o debilidad, la sangre circulante presiona contra las paredes de las arterias. La hipotensi?n ortost?tica es una disminuci?n repentina de la presi?n arterial que ocurre al  cambiar de posici?n, como cuando se pone de pie despu?s de estar recostado. ?Las arterias son los vasos sangu?neos que transportan la sangre desde el coraz?n hacia todas las partes del cuerpo. Cuando la presi?n arterial es demasiado baja, puede ser que no llegue suficiente sangre al cerebro o al resto de sus ?rganos. La hipotensi?n ortost?tica puede causar vah?dos, sudoraci?n, latidos card?acos r?pidos, visi?n borrosa y desmayos. Estos s?ntomas requieren que se investigue m?s la causa. ??Cu?les son las causas? ?La hipotensi?n ortost?tica puede deberse a muchas cosas, entre otras: ?Cambios repentinos en la postura, por ejemplo, ponerse de pie r?pidamente despu?s de haber estado sentado o acostado. ?P?rdida de sangre (anemia) o p?rdida  de l?quidos corporales (deshidrataci?n). ?Problemas card?acos, problemas neurol?gicos o problemas hormonales. ?Embarazo. ?Envejecimiento. El riesgo de esta afecci?n aumenta a medida que envejece. ?Infecci?n grave (sepsis). ?Ciertos medicamentos, como medicamentos para la presi?n arterial alta o medicamentos que hacen que el cuerpo pierda el exceso de l?quidos (diur?ticos). ??Cu?les son los signos o s?ntomas? ?Los s?ntomas de esta afecci?n pueden incluir los siguientes: ?Debilidad, vah?dos o mareos. ?Sudoraci?n. ?Visi?n borrosa. ?Cansancio (fatiga). ?Latidos card?acos r?pidos. ?Desmayos, cuando los casos son graves. ??C?mo se diagnostica? ?Esta afecci?n se diagnostica en funci?n de lo siguiente: ?Los s?ntomas y los antecedentes m?dicos. ?La medici?n de la presi?n arterial. El m?dico le controlar? la presi?n arterial cuando usted est?: ?Acostado. ?Sentado. ?De pie. ?La lectura de la presi?n arterial se registra con dos n?meros, por ejemplo ?120 sobre 80? (o 120/80). El primer n?mero (?superior?) es la presi?n sist?lica. Es la medida de la presi?n de las arterias cuando el coraz?n late. El segundo n?mero (?inferior?) es la presi?n diast?lica. Es la medida de la presi?n en las arterias  cuando el coraz?n se relaja entre latidos. La presi?n arterial se mide en una unidad llamada mmHg. Una presi?n arterial saludable para la mayor?a de los adultos es de 120/80 mmHg. La hipotensi?n ortost?tica se defi

## 2021-05-13 ENCOUNTER — Ambulatory Visit (INDEPENDENT_AMBULATORY_CARE_PROVIDER_SITE_OTHER): Payer: Medicaid Other | Admitting: Cardiology

## 2021-05-13 ENCOUNTER — Encounter: Payer: Self-pay | Admitting: Cardiology

## 2021-05-13 VITALS — BP 116/70 | HR 86 | Ht 62.0 in | Wt 179.0 lb

## 2021-05-13 DIAGNOSIS — R55 Syncope and collapse: Secondary | ICD-10-CM | POA: Diagnosis not present

## 2021-05-13 NOTE — Patient Instructions (Addendum)
Medication Instructions:  ? ?Your physician recommends that you continue on your current medications as directed. Please refer to the Current Medication list given to you today. ? ?*If you need a refill on your cardiac medications before your next appointment, please call your pharmacy* ? ? ?Follow-Up: ? ?[place] ? ? ?Other Instructions ? ?Hipotensi?n ortost?tica ?Orthostatic Hypotension ?La presi?n arterial mide con cu?nta fuerza, o debilidad, la sangre circulante presiona contra las paredes de las arterias. La hipotensi?n ortost?tica es una disminuci?n repentina de la presi?n arterial que ocurre al cambiar de posici?n, como cuando se pone de pie despu?s de estar recostado. ?Las arterias son los vasos sangu?neos que transportan la sangre desde el coraz?n hacia todas las partes del cuerpo. Cuando la presi?n arterial es demasiado baja, puede ser que no llegue suficiente sangre al cerebro o al resto de sus ?rganos. La hipotensi?n ortost?tica puede causar vah?dos, sudoraci?n, latidos card?acos r?pidos, visi?n borrosa y desmayos. Estos s?ntomas requieren que se investigue m?s la causa. ??Cu?les son las causas? ?La hipotensi?n ortost?tica puede deberse a muchas cosas, entre otras: ?Cambios repentinos en la postura, por ejemplo, ponerse de pie r?pidamente despu?s de haber estado sentado o acostado. ?P?rdida de sangre (anemia) o p?rdida de l?quidos corporales (deshidrataci?n). ?Problemas card?acos, problemas neurol?gicos o problemas hormonales. ?Embarazo. ?Envejecimiento. El riesgo de esta afecci?n aumenta a medida que envejece. ?Infecci?n grave (sepsis). ?Ciertos medicamentos, como medicamentos para la presi?n arterial alta o medicamentos que hacen que el cuerpo pierda el exceso de l?quidos (diur?ticos). ??Cu?les son los signos o s?ntomas? ?Los s?ntomas de esta afecci?n pueden incluir los siguientes: ?Debilidad, vah?dos o mareos. ?Sudoraci?n. ?Visi?n borrosa. ?Cansancio  (fatiga). ?Latidos card?acos r?pidos. ?Desmayos, cuando los casos son graves. ??C?mo se diagnostica? ?Esta afecci?n se diagnostica en funci?n de lo siguiente: ?Los s?ntomas y los antecedentes m?dicos. ?La medici?n de la presi?n arterial. El m?dico le controlar? la presi?n arterial cuando usted est?: ?Acostado. ?Sentado. ?De pie. ?La lectura de la presi?n arterial se registra con dos n?meros, por ejemplo ?120 sobre 80? (o 120/80). El primer n?mero (?superior?) es la presi?n sist?lica. Es la medida de la presi?n de las arterias cuando el coraz?n late. El segundo n?mero (?inferior?) es la presi?n diast?lica. Es la medida de la presi?n en las arterias cuando el coraz?n se relaja entre latidos. La presi?n arterial se mide en una unidad llamada mmHg. Una presi?n arterial saludable para la mayor?a de los adultos es de 120/80 mmHg. La hipotensi?n ortost?tica se define como una ca?da de 20 mmHg en la presi?n sist?lica o una ca?da de 10 mmHg en la presi?n diast?lica dentro de los 3 minutos de estar de pie. ?Entre las pruebas e informaci?n que pueden ayudar a Nurse, children's hipotensi?n ortost?tica se incluyen las siguientes: ?Sus otros signos vitales, por ejemplo la frecuencia card?aca y Therapist, sports. ?Pruebas de [place]. ?Un electrocardiograma (ECG) o un ecocardiograma. ?Monitor Holter. Este es un dispositivo que se [place] y Development worker, community el ritmo card?aco de manera continua, generalmente durante 24 a 48 horas. ?Prueba de basculaci?n. Para realizar esta prueba, se lo sujeta de forma segura a una mesa que lo mover? de una posici?n acostada a una posici?n vertical. Durante la prueba, se controlar?n el ritmo card?aco y la presi?n arterial. ??C?mo se trata? ?El tratamiento para esta afecci?n puede incluir lo siguiente: ?Cambios en la dieta. Estos cambios implican comer con m?s sal (sodio) o tomar m?s agua. ?Cambiar la dosis de ciertos medicamentos que toma y que podr?an bajar su  presi?n arterial. ?Corregir el motivo subyacente de la  hipotensi?n ortost?tica. ?Usar medias de compresi?n. ?Medicamentos para elevar la presi?n arterial. ?Evitar las acciones desencadenantes de los s?ntomas. ?Siga estas instrucciones en su casa: ?Medicamentos ?Use los medicamentos de venta libre y los recetados solamente como se lo haya indicado el m?dico. ?Siga las instrucciones del m?dico respecto del cambio de la dosis de sus medicamentos actuales, si corresponde. ?No deje de tomar los medicamentos ni modifique la dosis por su cuenta. ?Comida y bebida ?Illustration of a person drinking a glass of water. ? ?Beber suficiente l?quido como para mantener la orina de color [place] p?lido. ?Consuma la sal adicional ?nicamente como se lo hayan indicado. No agregue sal adicional a su dieta a menos que se lo indique el m?dico. ?Haga comidas peque?as y frecuentes. ?Evite ponerse de pie de repente despu?s de comer. ?Instrucciones generales ?Compression stockings on a person's lower legs. ? ?Lev?ntese despacio cuando est? acostado o sentado. Esto posibilitar? que la presi?n arterial se adapte. ?Evite las duchas calientes o el calor excesivo como se lo haya indicado el m?dico. ?Haga actividad f?sica regularmente como se lo haya indicado el m?dico. ?Si tiene medias de compresi?n, ?selas como se lo hayan indicado. ?Concurra a [place]. Esto es importante. ?Comun?quese con un m?dico si: ?Tiene fiebre por m?s de 2 a 3 d?as. ?Tiene m?s sed que lo habitual. ?Se siente mareado o d?bil. ?Solicite ayuda de inmediato si: ?Siente dolor en el pecho. ?Tiene latidos card?acos r?pidos o irregulares. ?Est? sudoroso o siente mareos. ?Siente que le falta el aire. ?Se desmaya. ?Tiene s?ntomas de un accidente cerebrovascular. ?BE FAST? es una manera f?cil de recordar las principales se?ales de advertencia de un accidente cerebrovascular: ?B: Balance (equilibrio). Los signos son mareos, dificultad repentina para caminar o p?rdida del equilibrio. ?E: Eyes (ojos). Los signos  son problemas para ver o un cambio repentino en la visi?n. ?F: Face (rostro). Los signos son debilidad repentina o adormecimiento del 69, o el rostro o el p?rpado que se caen hacia un lado. ?A: Arms (brazos). Los signos son debilidad o adormecimiento en un brazo. Esto sucede de repente y generalmente en un lado del cuerpo. ?S: Speech (habla). Los signos son dificultad para hablar, hablar arrastrando las palabras o dificultad para comprender lo que las Patent examiner. ?T: Time (tiempo). Es tiempo de llamar al servicio de Multimedia programmer. Anote la hora a la que [employer] s?ntomas. ?Presenta otros signos de un accidente cerebrovascular, como los siguientes: ?Dolor de cabeza s?bito e intenso que no tiene causa aparente. ?N?useas o v?mitos. ?Convulsiones. ?Estos s?ntomas pueden representar un problema grave que constituye Engineer, maintenance (IT). No espere a ver si los s?ntomas desaparecen. Solicite atenci?n m?dica de inmediato. Comun?quese con el servicio de emergencias de su localidad (911 en los [place]). No conduzca por sus propios medios [employer]. ?Resumen ?La hipotensi?n ortost?tica es la ca?da s?bita de la presi?n arterial. ?Puede causar vah?dos, sudoraci?n, latidos card?acos r?pidos, visi?n borrosa y desmayos. ?La hipotensi?n ortost?tica se diagnostica midi?ndole la presi?n arterial mientras est? acostado, sentado y luego de pie. ?El tratamiento puede implicar cambiar su dieta, usar medias de compresi?n, sentarse lentamente, ajustar sus medicamentos o corregir el motivo subyacente de la hipotensi?n ortost?tica. ?Obtenga ayuda de inmediato si tiene dolor de pecho, latidos card?acos r?pidos o irregulares, o s?ntomas de un accidente cerebrovascular. ?Esta informaci?n no tiene Marine scientist el consejo del m?dico. Aseg?rese de hacerle al m?dico cualquier pregunta que tenga. ?Document Revised: 04/28/2020 Document Reviewed: 04/27/2020 ?Elsevier  Patient Education ? 2022 [place]. ?   ? ? ?[place] compresi?n ?How to Use Compression Stockings ?Compression stockings on a person's lower legs. ? ?[place] compresi?n son medias el?sticas que ayudan a aumentar el flujo (la circulaci?n

## 2021-05-25 DIAGNOSIS — O42919 Preterm premature rupture of membranes, unspecified as to length of time between rupture and onset of labor, unspecified trimester: Secondary | ICD-10-CM | POA: Insufficient documentation

## 2021-06-10 ENCOUNTER — Ambulatory Visit: Payer: Medicaid Other | Admitting: Cardiology

## 2021-08-30 DIAGNOSIS — E559 Vitamin D deficiency, unspecified: Secondary | ICD-10-CM | POA: Insufficient documentation

## 2022-04-21 ENCOUNTER — Encounter (HOSPITAL_COMMUNITY): Payer: Self-pay

## 2022-04-21 ENCOUNTER — Other Ambulatory Visit: Payer: Self-pay

## 2022-04-21 ENCOUNTER — Emergency Department (HOSPITAL_COMMUNITY)
Admission: EM | Admit: 2022-04-21 | Discharge: 2022-04-21 | Disposition: A | Discharge status: Home / Self Care | Payer: Medicaid Other | Attending: Emergency Medicine | Admitting: Emergency Medicine

## 2022-04-21 DIAGNOSIS — Z20822 Contact with and (suspected) exposure to covid-19: Secondary | ICD-10-CM | POA: Diagnosis not present

## 2022-04-21 DIAGNOSIS — J039 Acute tonsillitis, unspecified: Secondary | ICD-10-CM

## 2022-04-21 DIAGNOSIS — J029 Acute pharyngitis, unspecified: Secondary | ICD-10-CM | POA: Diagnosis present

## 2022-04-21 LAB — RESP PANEL BY RT-PCR (RSV, FLU A&B, COVID)  RVPGX2
Influenza A by PCR: NEGATIVE
Influenza B by PCR: NEGATIVE
Resp Syncytial Virus by PCR: NEGATIVE
SARS Coronavirus 2 by RT PCR: NEGATIVE

## 2022-04-21 LAB — GROUP A STREP BY PCR: Group A Strep by PCR: NOT DETECTED

## 2022-04-21 MED ORDER — AMOXICILLIN 500 MG PO CAPS: 500.0000 mg | ORAL_CAPSULE | Freq: Three times a day (TID) | ORAL | 0 refills | Status: DC

## 2022-04-21 MED ORDER — AMOXICILLIN 500 MG PO CAPS
500.0000 mg | ORAL_CAPSULE | Freq: Once | ORAL | Status: AC
  Administered 2022-04-21: 500 mg via ORAL
  Filled 2022-04-21: qty 1

## 2022-04-21 NOTE — ED Provider Notes (Signed)
[facility] Emergency Department Provider Note MRN:  [id number]  Arrival date & time: 04/21/22     Chief Complaint   Sore Throat   History of Present Illness   Robyn Ball is a 22 y.o. year-old female presents to the ED with chief complaint of sore throat, dry cough, ear pain, and face pain for the past 3 days.  She denies any successful treatments prior to arrival.  Denies pregnancy or breast-feeding.  Worsen with swallowing.  History provided by patient.   Review of Systems  Pertinent positive and negative review of systems noted in HPI.    Physical Exam   Vitals:   04/21/22 0209 04/21/22 0235  BP: 114/81 111/75  Pulse: 66 71  Resp: 18 16  Temp: 97.9 F (36.6 C) 98.1 F (36.7 C)  SpO2: 99% 97%    CONSTITUTIONAL: Nontoxic-appearing, NAD NEURO:  Alert and oriented x 3, CN 3-12 grossly intact EYES:  eyes equal and reactive ENT/NECK:  Supple, no stridor, tonsillar erythema and edema with bilateral tonsillar exudates, no visible abscess CARDIO: Normal rate, normal regular rhythm, appears well-perfused  PULM:  No respiratory distress, CTAB GI/GU:  non-distended,  MSK/SPINE:  No gross deformities, no edema, moves all extremities  SKIN:  no rash, atraumatic   *Additional and/or pertinent findings included in MDM below  Diagnostic and Interventional Summary    EKG Interpretation  Date/Time:    Ventricular Rate:    PR Interval:    QRS Duration:   QT Interval:    QTC Calculation:   R Axis:     Text Interpretation:         Labs Reviewed  GROUP A STREP BY PCR  RESP PANEL BY RT-PCR (RSV, FLU A&B, COVID)  [id number]    No orders to display    Medications  amoxicillin (AMOXIL) capsule 500 mg (has no administration in time range)     Procedures  /  Critical Care Procedures  ED Course and Medical Decision Making  I have reviewed the triage vital signs, the nursing notes, and pertinent available records from the EMR.  Social  Determinants Affecting Complexity of Care: Patient has no clinically significant social determinants affecting this chief complaint..   ED Course:    Medical Decision Making Patient here with sore throat.  Physical exam consistent with tonsillitis without peritonsillar abscess.  Will treat with amoxicillin.  Risk Prescription drug management.     Consultants: Emergency department workup does not suggest an emergent condition requiring admission or immediate intervention beyond  what has been performed at this time. The patient is safe for discharge and has  been instructed to return immediately for worsening symptoms, change in  symptoms or any other concerns   Treatment and Plan: Emergency department workup does not suggest an emergent condition requiring admission or immediate intervention beyond  what has been performed at this time. The patient is safe for discharge and has  been instructed to return immediately for worsening symptoms, change in  symptoms or any other concerns    Final Clinical Impressions(s) / ED Diagnoses     ICD-10-CM   1. Tonsillitis  J03.90       ED Discharge Orders          Ordered    amoxicillin (AMOXIL) 500 MG capsule  3 times daily        04/21/22 0246              Discharge Instructions Discussed with and Provided to Patient:  Discharge Instructions   None      Montine Circle, PA-C 04/21/22 0249    Fatima Blank, MD 04/21/22 [phone]

## 2022-04-21 NOTE — ED Triage Notes (Addendum)
Pt arrived to triage complaining of sore throat that feels swollen, x3 days.   Also has a dry cough and headache   Pt states pain with swallowing but she is able to maintain secretions Has been taking OTC meds without relief

## 2022-06-07 ENCOUNTER — Emergency Department (HOSPITAL_COMMUNITY)
Admission: EM | Admit: 2022-06-07 | Discharge: 2022-06-07 | Disposition: A | Discharge status: Home / Self Care | Payer: Medicaid Other | Attending: Student | Admitting: Student

## 2022-06-07 ENCOUNTER — Emergency Department (HOSPITAL_COMMUNITY): Payer: Medicaid Other

## 2022-06-07 ENCOUNTER — Other Ambulatory Visit: Payer: Self-pay

## 2022-06-07 ENCOUNTER — Encounter (HOSPITAL_COMMUNITY): Payer: Self-pay

## 2022-06-07 DIAGNOSIS — J45909 Unspecified asthma, uncomplicated: Secondary | ICD-10-CM | POA: Insufficient documentation

## 2022-06-07 DIAGNOSIS — R079 Chest pain, unspecified: Secondary | ICD-10-CM | POA: Diagnosis present

## 2022-06-07 DIAGNOSIS — R0789 Other chest pain: Secondary | ICD-10-CM | POA: Diagnosis not present

## 2022-06-07 LAB — CBC WITH DIFFERENTIAL/PLATELET
Abs Immature Granulocytes: 0.01 10*3/uL (ref 0.00–0.07)
Basophils Absolute: 0.1 10*3/uL (ref 0.0–0.1)
Basophils Relative: 1 %
Eosinophils Absolute: 0.2 10*3/uL (ref 0.0–0.5)
Eosinophils Relative: 3 %
HCT: 43.1 % (ref 36.0–46.0)
Hemoglobin: 14.1 g/dL (ref 12.0–15.0)
Immature Granulocytes: 0 %
Lymphocytes Relative: 33 %
Lymphs Abs: 2.4 10*3/uL (ref 0.7–4.0)
MCH: 28.7 pg (ref 26.0–34.0)
MCHC: 32.7 g/dL (ref 30.0–36.0)
MCV: 87.8 fL (ref 80.0–100.0)
Monocytes Absolute: 0.8 10*3/uL (ref 0.1–1.0)
Monocytes Relative: 10 %
Neutro Abs: 4 10*3/uL (ref 1.7–7.7)
Neutrophils Relative %: 53 %
Platelets: 200 10*3/uL (ref 150–400)
RBC: 4.91 MIL/uL (ref 3.87–5.11)
RDW: 12.6 % (ref 11.5–15.5)
WBC: 7.5 10*3/uL (ref 4.0–10.5)
nRBC: 0 % (ref 0.0–0.2)

## 2022-06-07 LAB — COMPREHENSIVE METABOLIC PANEL
ALT: 47 U/L — ABNORMAL HIGH (ref 0–44)
AST: 33 U/L (ref 15–41)
Albumin: 3.9 g/dL (ref 3.5–5.0)
Alkaline Phosphatase: 84 U/L (ref 38–126)
Anion gap: 7 (ref 5–15)
BUN: 8 mg/dL (ref 6–20)
CO2: 23 mmol/L (ref 22–32)
Calcium: 8.8 mg/dL — ABNORMAL LOW (ref 8.9–10.3)
Chloride: 107 mmol/L (ref 98–111)
Creatinine, Ser: 0.6 mg/dL (ref 0.44–1.00)
GFR, Estimated: >60 mL/min (ref >60)
Glucose, Bld: 100 mg/dL — ABNORMAL HIGH (ref 70–99)
Potassium: 3.8 mmol/L (ref 3.5–5.1)
Sodium: 137 mmol/L (ref 135–145)
Total Bilirubin: 0.6 mg/dL (ref 0.3–1.2)
Total Protein: 7.4 g/dL (ref 6.5–8.1)

## 2022-06-07 LAB — D-DIMER, QUANTITATIVE: D-Dimer, Quant: 0.35 ug/mL-FEU (ref 0.00–0.50)

## 2022-06-07 LAB — TROPONIN I (HIGH SENSITIVITY)
Troponin I (High Sensitivity): 2 ng/L (ref <18)
Troponin I (High Sensitivity): 2 ng/L (ref <18)

## 2022-06-07 LAB — HCG, QUANTITATIVE, PREGNANCY: hCG, Beta Chain, Quant, S: <1 m[IU]/mL (ref <5)

## 2022-06-07 MED ORDER — OXYCODONE-ACETAMINOPHEN 5-325 MG PO TABS
1.0000 | ORAL_TABLET | Freq: Once | ORAL | Status: AC
  Administered 2022-06-07: 1 via ORAL
  Filled 2022-06-07: qty 1

## 2022-06-07 MED ORDER — SODIUM CHLORIDE 0.9 % IV BOLUS
1000.0000 mL | Freq: Once | INTRAVENOUS | Status: AC
  Administered 2022-06-07: 1000 mL via INTRAVENOUS

## 2022-06-07 MED ORDER — ACETAMINOPHEN 500 MG PO TABS
1000.0000 mg | ORAL_TABLET | Freq: Once | ORAL | Status: AC
  Administered 2022-06-07: 1000 mg via ORAL
  Filled 2022-06-07: qty 2

## 2022-06-07 MED ORDER — FAMOTIDINE 20 MG PO TABS: 20.0000 mg | ORAL_TABLET | Freq: Two times a day (BID) | ORAL | 0 refills | Status: DC

## 2022-06-07 NOTE — ED Provider Notes (Signed)
[place] [facility]ERGENCY DE[facility]MENT AT [facility] Provider Note   CSN: [id number] Arrival date & time: 06/07/22  1400     History Chief Complaint  Patient presents with   Chest Pain    Robyn Ball is a 22 y.o. female. Patient presents to the emergency department for chest pain. She reports chest pain for the last 3 evenings typically worse when speaking. Has not noted worsen chest pain with sitting, standing, lying, or specific activity. Denies current use of hormonal contraceptives, prior history of clots, or persistent shortness of breath. Patient does have known history of asthma but has not been managing symptoms with any inhalers or other medications. Denies any known sick contacts recently.   Chest Pain      Home Medications Prior to Admission medications   Medication Sig Start Date End Date Taking? Authorizing Provider  famotidine (PEPCID) 20 MG tablet Take 1 tablet (20 mg total) by mouth 2 (two) times daily. 06/07/22  Yes Maryanna Shape A, PA-C  albuterol (PROVENTIL HFA;VENTOLIN HFA) 108 (90 BASE) MCG/ACT inhaler Inhale 4 puffs into the lungs every 4 (four) hours as needed for wheezing or shortness of breath. Patient not taking: Reported on 05/13/2021 04/22/14   Ardith Dark, MD  albuterol (PROVENTIL HFA;VENTOLIN HFA) 108 (90 Base) MCG/ACT inhaler Inhale 2 puffs into the lungs every 4 (four) hours as needed for wheezing or shortness of breath. Patient not taking: Reported on 05/13/2021 03/17/17   Little, Ambrose Finland, MD  amoxicillin (AMOXIL) 500 MG capsule Take 1 capsule (500 mg total) by mouth 3 (three) times daily. 04/21/22   Roxy Horseman, PA-C  cyclobenzaprine (FLEXERIL) 5 MG tablet Take 1 tablet (5 mg total) by mouth 3 (three) times daily as needed for muscle spasms. Patient not taking: Reported on 05/13/2021 03/29/21   Aviva Signs, CNM  Prenatal Vit-Fe Fumarate-FA (PREPLUS) 27-1 MG TABS Take 1 tablet by mouth daily.    [provider]   promethazine (PHENERGAN) 25 MG tablet Take by mouth. Patient not taking: Reported on 05/13/2021 09/14/20   [provider]      Allergies    Pollen extract    Review of Systems   Review of Systems  Cardiovascular:  Positive for chest pain.  All other systems reviewed and are negative.   Physical Exam Updated Vital Signs BP 114/66   Pulse 70   Temp 98.2 F (36.8 C) (Oral)   Resp 15   Ht 5\' 2"  (1.575 m)   Wt 92.1 kg   SpO2 99%   BMI 37.13 kg/m  Physical Exam Vitals and nursing note reviewed.  Constitutional:      General: She is not in acute distress.    Appearance: She is well-developed.  HENT:     Head: Normocephalic and atraumatic.  Eyes:     Conjunctiva/sclera: Conjunctivae normal.  Cardiovascular:     Rate and Rhythm: Normal rate and regular rhythm.     Heart sounds: Normal heart sounds. No murmur heard. Pulmonary:     Effort: Pulmonary effort is normal. No respiratory distress.     Breath sounds: Normal breath sounds.  Abdominal:     Palpations: Abdomen is soft.     Tenderness: There is no abdominal tenderness.  Musculoskeletal:        General: No swelling.     Cervical back: Neck supple.  Skin:    General: Skin is warm and dry.     Capillary Refill: Capillary refill takes less than 2 seconds.  Neurological:     Mental Status: She is alert.  Psychiatric:        Mood and Affect: Mood normal.     ED Results / Procedures / Treatments   Labs (all labs ordered are listed, but only abnormal results are displayed) Labs Reviewed  COMPREHENSIVE METABOLIC PANEL - Abnormal; Notable for the following components:      Result Value   Glucose, Bld 100 (*)    Calcium 8.8 (*)    ALT 47 (*)    All other components within normal limits  CBC WITH DIFFERENTIAL/PLATELET  HCG, QUANTITATIVE, PREGNANCY  D-DIMER, QUANTITATIVE (NOT AT [facility])  TROPONIN I (HIGH SENSITIVITY)  TROPONIN I (HIGH SENSITIVITY)    EKG None  Radiology DG Chest 2 View  Result Date:  06/07/2022 CLINICAL DATA:  Chest pain EXAM: CHEST - 2 VIEW COMPARISON:  None Available. FINDINGS: No pleural effusion. No pneumothorax. No focal airspace opacity. Normal cardiac and mediastinal contours. No radiographically apparent displaced rib fractures. Visualized upper abdomen is unremarkable. Vertebral body heights are maintained. IMPRESSION: No focal airspace opacity. Electronically Signed   By: Lorenza Cambridge M.D.   On: 06/07/2022 17:06    Procedures Procedures   Medications Ordered in ED Medications  sodium chloride 0.9 % bolus 1,000 mL (0 mLs Intravenous Stopped 06/07/22 1901)  acetaminophen (TYLENOL) tablet 1,000 mg (1,000 mg Oral Given 06/07/22 1814)  oxyCODONE-acetaminophen (PERCOCET/ROXICET) 5-325 MG per tablet 1 tablet (1 tablet Oral Given 06/07/22 1901)    ED Course/ Medical Decision Making/ A&P                           Medical Decision Making Amount and/or Complexity of Data Reviewed Labs: ordered. Radiology: ordered.  Risk OTC drugs. Prescription drug management.   This patient presents to the ED for concern of chest pain.  Differential diagnosis includes ACS, pneumonia, pneumothorax, viral URI, acute asthma exacerbation   Lab Tests:  I Ordered, and personally interpreted labs.  The pertinent results include: CBC unremarkable, CMP with minor hypocalcemia and slight elevation ALT otherwise unremarkable, negative troponin, negative D-dimer, negative hCG   Imaging Studies ordered:  I ordered imaging studies including chest x-ray I independently visualized and interpreted imaging which showed no acute cardiopulmonary abnormality I agree with the radiologist interpretation   Medicines ordered and prescription drug management:  I ordered medication including fluids, Tylenol, Percocet for pain Reevaluation of the patient after these medicines showed that the patient improved I have reviewed the patients home medicines and have made adjustments as needed   Problem  List / ED Course:  Patient presented to the emergency department complaints of chest pain.  She reports this pain has been present for about 3 days and has been intermittent in nature.  Reports that pain is worse when she takes deep breaths or tries to speak.  Denies any current use of any oral contraceptives.  No prior history of any clots.  No prior significant cardiac history.  Workup was initiated without any acute abnormalities noted on labs with negative troponin and negative D-dimer.  Given negative D-dimer, believe that risk for a pulmonary embolism is very low at this time so CT angiogram not ordered.  Minimal concern for acute asthma exacerbation without there is no audible wheezing noted on patient during physical exam and chest x-ray appears to be normal.  Unsure of exact etiology of patient's chest pain possible this could be due to acid reflux or some other benign  cause.  Advised patient that she can do a trial acid reducing medication such as Pepcid, omeprazole to determine if this helps alleviate some of her symptoms.  Otherwise advised patient to follow-up with primary care provider for further evaluation.  Patient is agreeable to treatment plan verbalized understanding return cautions.  All questions answered prior to patient discharge.  Final Clinical Impression(s) / ED Diagnoses Final diagnoses:  Other chest pain    Rx / DC Orders ED Discharge Orders          Ordered    famotidine (PEPCID) 20 MG tablet  2 times daily        06/07/22 1952              Salomon Mast 06/07/22 1954    Kommor, Wyn Forster, MD 06/07/22 2040

## 2022-06-07 NOTE — Discharge Instructions (Signed)
You are seen in the emergency department for chest pain.  Thankfully your workup was negative without any acute findings noted on labs or imaging.  Your troponin level was negative indicating no acute findings for any heart ischemia.  Your D-dimer test was also negative indicating a low risk for a pulmonary embolism and a blood clot.  Advise a follow-up with primary care provider for further evaluation.  We discussed possibly trialing a course of famotidine which is a new medication to treat acid reflux to determine if this will help alleviate some of her symptoms.  If it does this is likely suggestive that you have some level acid reflux present that may be causing her symptoms.  Regardless, please follow-up with your primary care provider for further evaluation.  If you feel your symptoms are acutely worsening, please return to the emergency department.

## 2022-06-07 NOTE — ED Notes (Signed)
Pt reported dizziness.  RN notified PA

## 2022-06-07 NOTE — ED Triage Notes (Addendum)
Pt to er, pt states that she is here for chest pain, states that it started about three nights ago, states that the pain is worse when she takes a deep breath or talks, states that she also has a cough and headaches.

## 2022-06-15 ENCOUNTER — Emergency Department (HOSPITAL_COMMUNITY): Payer: Medicaid Other

## 2022-06-15 ENCOUNTER — Other Ambulatory Visit: Payer: Self-pay

## 2022-06-15 ENCOUNTER — Emergency Department (HOSPITAL_COMMUNITY)
Admission: EM | Admit: 2022-06-15 | Discharge: 2022-06-15 | Disposition: A | Discharge status: Home / Self Care | Payer: Medicaid Other | Attending: Student in an Organized Health Care Education/Training Program | Admitting: Student in an Organized Health Care Education/Training Program

## 2022-06-15 DIAGNOSIS — R6883 Chills (without fever): Secondary | ICD-10-CM | POA: Diagnosis not present

## 2022-06-15 DIAGNOSIS — R112 Nausea with vomiting, unspecified: Secondary | ICD-10-CM | POA: Diagnosis present

## 2022-06-15 DIAGNOSIS — J45909 Unspecified asthma, uncomplicated: Secondary | ICD-10-CM | POA: Diagnosis not present

## 2022-06-15 DIAGNOSIS — Z7951 Long term (current) use of inhaled steroids: Secondary | ICD-10-CM | POA: Insufficient documentation

## 2022-06-15 DIAGNOSIS — R42 Dizziness and giddiness: Secondary | ICD-10-CM | POA: Diagnosis not present

## 2022-06-15 DIAGNOSIS — R1110 Vomiting, unspecified: Secondary | ICD-10-CM

## 2022-06-15 LAB — CBC WITH DIFFERENTIAL/PLATELET
Abs Immature Granulocytes: 0.03 10*3/uL (ref 0.00–0.07)
Basophils Absolute: 0.1 10*3/uL (ref 0.0–0.1)
Basophils Relative: 1 %
Eosinophils Absolute: 0 10*3/uL (ref 0.0–0.5)
Eosinophils Relative: 0 %
HCT: 44 % (ref 36.0–46.0)
Hemoglobin: 14.7 g/dL (ref 12.0–15.0)
Immature Granulocytes: 0 %
Lymphocytes Relative: 31 %
Lymphs Abs: 2.7 10*3/uL (ref 0.7–4.0)
MCH: 28.9 pg (ref 26.0–34.0)
MCHC: 33.4 g/dL (ref 30.0–36.0)
MCV: 86.4 fL (ref 80.0–100.0)
Monocytes Absolute: 1.3 10*3/uL — ABNORMAL HIGH (ref 0.1–1.0)
Monocytes Relative: 14 %
Neutro Abs: 4.7 10*3/uL (ref 1.7–7.7)
Neutrophils Relative %: 54 %
Platelets: 176 10*3/uL (ref 150–400)
RBC: 5.09 MIL/uL (ref 3.87–5.11)
RDW: 12.6 % (ref 11.5–15.5)
WBC: 8.7 10*3/uL (ref 4.0–10.5)
nRBC: 0 % (ref 0.0–0.2)

## 2022-06-15 LAB — COMPREHENSIVE METABOLIC PANEL
ALT: 48 U/L — ABNORMAL HIGH (ref 0–44)
AST: 31 U/L (ref 15–41)
Albumin: 4.1 g/dL (ref 3.5–5.0)
Alkaline Phosphatase: 86 U/L (ref 38–126)
Anion gap: 9 (ref 5–15)
BUN: 10 mg/dL (ref 6–20)
CO2: 22 mmol/L (ref 22–32)
Calcium: 9 mg/dL (ref 8.9–10.3)
Chloride: 106 mmol/L (ref 98–111)
Creatinine, Ser: 0.8 mg/dL (ref 0.44–1.00)
GFR, Estimated: >60 mL/min (ref >60)
Glucose, Bld: 92 mg/dL (ref 70–99)
Potassium: 3.6 mmol/L (ref 3.5–5.1)
Sodium: 137 mmol/L (ref 135–145)
Total Bilirubin: 0.7 mg/dL (ref 0.3–1.2)
Total Protein: 7.9 g/dL (ref 6.5–8.1)

## 2022-06-15 LAB — URINALYSIS, ROUTINE W REFLEX MICROSCOPIC
Bilirubin Urine: NEGATIVE
Glucose, UA: NEGATIVE mg/dL
Hgb urine dipstick: NEGATIVE
Ketones, ur: 5 mg/dL — AB
Leukocytes,Ua: NEGATIVE
Nitrite: NEGATIVE
Protein, ur: 30 mg/dL — AB
Specific Gravity, Urine: 1.024 (ref 1.005–1.030)
pH: 5 (ref 5.0–8.0)

## 2022-06-15 LAB — LIPASE, BLOOD: Lipase: 31 U/L (ref 11–51)

## 2022-06-15 LAB — PREGNANCY, URINE: Preg Test, Ur: NEGATIVE

## 2022-06-15 LAB — TROPONIN I (HIGH SENSITIVITY): Troponin I (High Sensitivity): 2 ng/L (ref <18)

## 2022-06-15 MED ORDER — FAMOTIDINE 20 MG PO TABS
20.0000 mg | ORAL_TABLET | Freq: Once | ORAL | Status: AC
  Administered 2022-06-15: 20 mg via ORAL
  Filled 2022-06-15: qty 1

## 2022-06-15 MED ORDER — LACTATED RINGERS IV BOLUS
1000.0000 mL | Freq: Once | INTRAVENOUS | Status: AC
  Administered 2022-06-15: 1000 mL via INTRAVENOUS

## 2022-06-15 MED ORDER — ONDANSETRON HCL 4 MG/2ML IJ SOLN
4.0000 mg | Freq: Once | INTRAMUSCULAR | Status: AC
  Administered 2022-06-15: 4 mg via INTRAVENOUS
  Filled 2022-06-15: qty 2

## 2022-06-15 MED ORDER — ONDANSETRON HCL 4 MG PO TABS: 4.0000 mg | ORAL_TABLET | Freq: Three times a day (TID) | ORAL | 0 refills | Status: DC | PRN

## 2022-06-15 NOTE — Discharge Instructions (Signed)
Continue to take the Zofran as needed for your nausea and vomiting.  We recommend you continue good oral hydration to avoid dehydration.  Please follow-up with your primary care physician for reevaluation and discussion of your ER visit.

## 2022-06-15 NOTE — ED Provider Notes (Signed)
[facility] AT [facility] Provider Note   CSN: [id number] Arrival date & time: 06/15/22  5621     History  Chief Complaint  Patient presents with   Emesis    Robyn Ball is a 22 y.o. female.  22 year old female presents to the emergency department for 2 days of nausea, vomiting, chills, and dizziness.  Patient reports past medical history of asthma but denies any other significant past medical history.  She reports a C-section but denies any other surgeries.  Denies any daily medications or known allergies.  Patient reports that she was seen last week here in our emergency department for chest discomfort.  She reports that this discomfort continues since last week but is unsure if this is related to her current symptoms.  Patient does not take any oral contraceptives and D-dimer was negative.  Overall workup for chest discomfort last week was unremarkable.    Emesis Associated symptoms: chills        Home Medications Prior to Admission medications   Medication Sig Start Date End Date Taking? Authorizing Provider  ondansetron (ZOFRAN) 4 MG tablet Take 1 tablet (4 mg total) by mouth every 8 (eight) hours as needed for nausea or vomiting. 06/15/22  Yes Maher Shon, DO  albuterol (PROVENTIL HFA;VENTOLIN HFA) 108 (90 BASE) MCG/ACT inhaler Inhale 4 puffs into the lungs every 4 (four) hours as needed for wheezing or shortness of breath. Patient not taking: Reported on 05/13/2021 04/22/14   Ardith Dark, MD  albuterol (PROVENTIL HFA;VENTOLIN HFA) 108 (90 Base) MCG/ACT inhaler Inhale 2 puffs into the lungs every 4 (four) hours as needed for wheezing or shortness of breath. Patient not taking: Reported on 05/13/2021 03/17/17   Little, Ambrose Finland, MD  amoxicillin (AMOXIL) 500 MG capsule Take 1 capsule (500 mg total) by mouth 3 (three) times daily. 04/21/22   Roxy Horseman, PA-C  cyclobenzaprine (FLEXERIL) 5 MG tablet Take 1 tablet (5 mg total) by  mouth 3 (three) times daily as needed for muscle spasms. Patient not taking: Reported on 05/13/2021 03/29/21   Aviva Signs, CNM  famotidine (PEPCID) 20 MG tablet Take 1 tablet (20 mg total) by mouth 2 (two) times daily. 06/07/22   Smitty Knudsen, PA-C  Prenatal Vit-Fe Fumarate-FA (PREPLUS) 27-1 MG TABS Take 1 tablet by mouth daily.    [provider]  promethazine (PHENERGAN) 25 MG tablet Take by mouth. Patient not taking: Reported on 05/13/2021 09/14/20   [provider]      Allergies    Pollen extract    Review of Systems   Review of Systems  Constitutional:  Positive for chills.  Cardiovascular:  Positive for chest pain.  Gastrointestinal:  Positive for nausea and vomiting.  All other systems reviewed and are negative.   Physical Exam Updated Vital Signs BP 116/82 (BP Location: Left Arm)   Pulse (!) 101   Temp 98.7 F (37.1 C) (Oral)   Resp 16   Ht 5\' 2"  (1.575 m)   Wt 92.1 kg   SpO2 98%   BMI 37.13 kg/m  Physical Exam Vitals and nursing note reviewed.  Constitutional:      General: She is not in acute distress.    Appearance: She is well-developed.  HENT:     Head: Normocephalic and atraumatic.     Nose: Nose normal.     Mouth/Throat:     Mouth: Mucous membranes are moist.  Eyes:     Conjunctiva/sclera: Conjunctivae normal.  Cardiovascular:  Rate and Rhythm: Normal rate and regular rhythm.     Heart sounds: No murmur heard. Pulmonary:     Effort: Pulmonary effort is normal. No respiratory distress.     Breath sounds: Normal breath sounds.  Abdominal:     General: Abdomen is flat.     Palpations: Abdomen is soft.     Tenderness: There is no abdominal tenderness.  Musculoskeletal:        General: No swelling.     Cervical back: Neck supple.  Skin:    General: Skin is warm and dry.     Capillary Refill: Capillary refill takes less than 2 seconds.  Neurological:     Mental Status: She is alert.  Psychiatric:        Mood and Affect:  Mood normal.     ED Results / Procedures / Treatments   Labs (all labs ordered are listed, but only abnormal results are displayed) Labs Reviewed  COMPREHENSIVE METABOLIC PANEL - Abnormal; Notable for the following components:      Result Value   ALT 48 (*)    All other components within normal limits  CBC WITH DIFFERENTIAL/PLATELET - Abnormal; Notable for the following components:   Monocytes Absolute 1.3 (*)    All other components within normal limits  URINALYSIS, ROUTINE W REFLEX MICROSCOPIC - Abnormal; Notable for the following components:   APPearance HAZY (*)    Ketones, ur 5 (*)    Protein, ur 30 (*)    Bacteria, UA FEW (*)    All other components within normal limits  PREGNANCY, URINE  LIPASE, BLOOD  TROPONIN I (HIGH SENSITIVITY)    EKG None  Radiology DG Chest 1 View  Result Date: 06/15/2022 CLINICAL DATA:  Chest pain.  Vomiting. EXAM: CHEST  1 VIEW COMPARISON:  CXR 06/07/22 FINDINGS: The heart size and mediastinal contours are within normal limits. Both lungs are clear. The visualized skeletal structures are unremarkable. IMPRESSION: No focal airspace opacity. Electronically Signed   By: Lorenza Cambridge M.D.   On: 06/15/2022 10:43    Procedures Procedures    Medications Ordered in ED Medications  lactated ringers bolus 1,000 mL (1,000 mLs Intravenous New Bag/Given 06/15/22 0958)  ondansetron (ZOFRAN) injection 4 mg (4 mg Intravenous Given 06/15/22 1610)    ED Course/ Medical Decision Making/ A&P                             Medical Decision Making 22 year old female presenting to the emergency department for evaluation of her nausea, vomiting, and chills over the last 2 days.  Patient is afebrile here in the emergency department and overall stable appearing.  Differential includes viral illness, pregnancy, gastritis, acid reflux, gallbladder dysfunction, and others.  Patient reports that her intermittent chest pain has continued since last week.  Tender to touch  along her chest and workup last week was unremarkable.  She has no reported cardiac history and she is PERC negative.  Labs, IV fluids, and Zofran ordered during her ED evaluation.  Reevaluation 1130: Patient's blood work and imaging showed no acute abnormalities at this time.  Her vitals are stable and heart rate is within normal limits.  She was given IV fluids and Zofran for symptoms. We will also go ahead and give her some Pepcid.  EKG shows sinus rhythm rate 96, normal axis, normal intervals, no ST segment abnormalities.  Chest x-ray clear for any evidence of pneumonia or congestion.  Patient  stable for discharge at this time to follow-up with her PCP.  Amount and/or Complexity of Data Reviewed Labs: ordered. Radiology: ordered.  Risk Prescription drug management.          Final Clinical Impression(s) / ED Diagnoses Final diagnoses:  Vomiting, unspecified vomiting type, unspecified whether nausea present    Rx / DC Orders ED Discharge Orders          Ordered    ondansetron (ZOFRAN) 4 MG tablet  Every 8 hours PRN        06/15/22 1132              Candice Lunney, DO 06/15/22 1136

## 2022-06-15 NOTE — ED Triage Notes (Signed)
Pt reports dizziness, n/v and "shaking" since last night. Pt req pregnancy test, lmp Feb 2024

## 2023-04-06 ENCOUNTER — Ambulatory Visit (HOSPITAL_COMMUNITY)
Admission: EM | Admit: 2023-04-06 | Discharge: 2023-04-06 | Disposition: A | Discharge status: Home / Self Care | Payer: Medicaid Other | Attending: Family Medicine | Admitting: Family Medicine

## 2023-04-06 ENCOUNTER — Encounter (HOSPITAL_COMMUNITY): Payer: Self-pay

## 2023-04-06 DIAGNOSIS — J4521 Mild intermittent asthma with (acute) exacerbation: Secondary | ICD-10-CM | POA: Diagnosis not present

## 2023-04-06 MED ORDER — ALBUTEROL SULFATE HFA 108 (90 BASE) MCG/ACT IN AERS: 1.0000 | INHALATION_SPRAY | Freq: Four times a day (QID) | RESPIRATORY_TRACT | 0 refills | Status: AC | PRN

## 2023-04-06 MED ORDER — PREDNISONE 20 MG PO TABS: 40.0000 mg | ORAL_TABLET | Freq: Every day | ORAL | 0 refills | Status: AC

## 2023-04-06 NOTE — ED Triage Notes (Addendum)
 Patient reports that she has tonsil swelling, cough, headache, nasal congestion,  and states "my chest hurts when I cough and speak"  Patient states she went to an UC 3 days ago and was prescribed Amoxicillin. Patient added that she had a negative Covid and Flu test 3 days ago.

## 2023-04-06 NOTE — Discharge Instructions (Addendum)
 Use the albuterol inhaler every 6 hours as needed for any wheezing or shortness of breath.  Start the steroids today and take them daily with breakfast.  Ensure you are drinking at least 64 ounces of water, 1200 mg of Mucinex daily can help loosen secretions.  Sleeping with a humidifier may help as well.  Continue your antibiotics as prescribed and until finished.  Return to clinic for any new or urgent symptoms.

## 2023-04-06 NOTE — ED Provider Notes (Signed)
 [facility]    CSN: [id number] Arrival date & time: 04/06/23  0805      History   Chief Complaint Chief Complaint  Patient presents with   Sore Throat   Cough   Headache   Nasal Congestion    HPI Robyn Ball is a 22 y.o. female.   Patient presents to clinic over concerns of sore throat, cough, headache, nasal congestion and shortness of breath.  She is having central chest pain when she coughs and when she speaks.  Cough is productive with phlegm, occasional streaks of blood.  She has tested negative for influenza, COVID-19 and strep.  At an urgent care 3 days ago she was diagnosed with tonsillitis and started on amoxicillin.  Has been taking this as prescribed, last dose was this morning.  Reports her throat is feeling a little bit better.  Has a history of asthma, does not currently have an inhaler.   The history is provided by the patient and medical records.  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 Authorizing Provider  albuterol (VENTOLIN HFA) 108 (90 Base) MCG/ACT inhaler Inhale 1-2 puffs into the lungs every 6  (six) hours as needed for wheezing or shortness of breath. 04/06/23  Yes Rinaldo Ratel, [place] N, FNP  predniSONE (DELTASONE) 20 MG tablet Take 2 tablets (40 mg total) by mouth daily for 5 days. 04/06/23 04/11/23 Yes Rinaldo Ratel, [place] N, FNP  amoxicillin (AMOXIL) 500 MG capsule Take 1 capsule (500 mg total) by mouth 3 (three) times daily. 04/21/22   Roxy Horseman, PA-C  cyclobenzaprine (FLEXERIL) 5 MG tablet Take 1 tablet (5 mg total) by mouth 3 (three) times daily as needed for muscle spasms. Patient not taking: Reported on 05/13/2021 03/29/21   Aviva Signs, CNM  Prenatal Vit-Fe Fumarate-FA (PREPLUS) 27-1 MG TABS Take 1 tablet by mouth daily.    [provider]  promethazine (PHENERGAN) 25 MG tablet Take by mouth. Patient not taking: Reported on 05/13/2021 09/14/20   [provider]    Family History Family History  Problem Relation Age of Onset   Diabetes Father     Social History Social History   Tobacco Use   Smoking status: Never   Smokeless tobacco: Never  Vaping Use   Vaping status: Never Used  Substance Use Topics   Alcohol  use: No   Drug use: No     Allergies   Pollen extract   Review of Systems Review of Systems  Per HPI   Physical Exam Triage Vital Signs ED Triage Vitals  Encounter Vitals Group     BP 04/06/23 0822 120/82     Systolic BP Percentile --      Diastolic BP Percentile --      Pulse Rate 04/06/23 0822 88     Resp 04/06/23 0822 14     Temp 04/06/23 0822 98.8 F (37.1 C)     Temp Source 04/06/23 0822 Oral     SpO2 04/06/23 0822 95 %     Weight --      Height --      Head Circumference --      Peak Flow --      Pain Score 04/06/23 0821 5     Pain Loc --      Pain Education --      Exclude from Growth Chart --    No data found.  Updated Vital Signs BP 120/82 (BP Location: Left Arm)   Pulse 88   Temp 98.8 F (37.1 C) (Oral)   Resp 14   LMP 07/20/2022   SpO2 95%   Visual Acuity Right Eye Distance:   Left Eye  Distance:   Bilateral Distance:    Right Eye Near:   Left Eye Near:    Bilateral Near:     Physical Exam Vitals and nursing note reviewed.  Constitutional:      Appearance: She is well-developed.  HENT:     Head: Normocephalic and atraumatic.     Nose: Congestion and rhinorrhea present.     Mouth/Throat:     Mouth: Mucous membranes are moist.     Pharynx: Uvula midline. Posterior oropharyngeal erythema present.     Tonsils: No tonsillar exudate or tonsillar abscesses. 2+ on the right. 2+ on the left.  Eyes:     Conjunctiva/sclera: Conjunctivae normal.  Cardiovascular:     Rate and Rhythm: Normal rate and regular rhythm.     Heart sounds: Normal heart sounds. No murmur heard. Pulmonary:     Effort: Pulmonary effort is normal. No respiratory distress.     Breath sounds: Normal breath sounds. No wheezing.  Skin:    General: Skin is warm and dry.  Neurological:     General: No focal deficit present.     Mental Status: She is alert.  Psychiatric:        Mood and Affect: Mood normal.      UC Treatments / Results  Labs (all labs ordered are listed, but only abnormal results are displayed) Labs Reviewed - No data to display  EKG   Radiology No results found.  Procedures Procedures (including critical care time)  Medications Ordered in UC Medications - No data to display  Initial Impression / Assessment and Plan / UC Course  I have reviewed the triage vital signs and the nursing notes.  Pertinent labs & imaging results that were available during my care of the patient were reviewed by me and considered in my medical decision making (see chart for details).  Vitals and triage reviewed, patient is hemodynamically stable.  Lungs are vesicular, heart with regular rate and rhythm.  Tonsils with 2+ swelling, no exudate or abscess.  Congestion and rhinorrhea present.  Uvula midline, posterior pharynx erythema.  Patient endorses shortness of breath, suspect mild asthma  exacerbation.  Will send in  inhaler and prednisone burst to treat this.  Symptomatic management encouraged.  Plan of care, follow-up care return precautions given, no questions at this time.  Work note provided.     Final Clinical Impressions(s) / UC Diagnoses   Final diagnoses:  Mild intermittent asthma with acute exacerbation     Discharge Instructions      Use the albuterol inhaler every 6 hours as needed for any wheezing or shortness of breath.  Start the steroids today and take them daily with breakfast.  Ensure you are drinking at least 64 ounces of water, 1200 mg of Mucinex daily can help loosen secretions.  Sleeping with a humidifier may help as well.  Continue your antibiotics as prescribed and until finished.  Return to clinic for any new or urgent symptoms.    ED Prescriptions     Medication Sig Dispense Auth. Provider   albuterol (VENTOLIN HFA) 108 (90 Base) MCG/ACT inhaler Inhale 1-2 puffs into the lungs every 6 (six) hours as needed for wheezing or shortness of breath. 18 g Rinaldo Ratel, [place] N, [place]   predniSONE (DELTASONE) 20 MG tablet Take 2 tablets (40 mg total) by mouth daily for 5 days. 10 tablet Nikeia Henkes, [place] N, FNP      PDMP not reviewed this encounter.   Rinaldo Ratel [place] N, [place] 04/06/23 [phone]

## 2023-04-24 ENCOUNTER — Other Ambulatory Visit: Payer: Self-pay

## 2023-04-24 ENCOUNTER — Ambulatory Visit (INDEPENDENT_AMBULATORY_CARE_PROVIDER_SITE_OTHER)

## 2023-04-24 VITALS — BP 114/74 | HR 55 | Wt 205.7 lb

## 2023-04-24 DIAGNOSIS — N912 Amenorrhea, unspecified: Secondary | ICD-10-CM

## 2023-04-24 DIAGNOSIS — Z3202 Encounter for pregnancy test, result negative: Secondary | ICD-10-CM

## 2023-04-24 LAB — POCT PREGNANCY, URINE: Preg Test, Ur: NEGATIVE

## 2023-04-24 NOTE — Progress Notes (Signed)
  History:  Ms. Robyn Ball is a 23 y.o. G1P0 who presents to clinic today with complaint of possible pregnancy.    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   Review of Systems:  Pertinent items noted in HPI and remainder of comprehensive ROS otherwise negative.  Objective:  Physical Exam BP 114/74   Pulse (!) 55   Wt 205 lb 11.2 oz (93.3 kg)   LMP 07/08/2022   BMI 37.62 kg/m  Physical Exam   Labs and Imaging Results for orders placed or performed in visit on 04/24/23 (from the past 24 hours)  Pregnancy, urine POC     Status: None   Collection Time: 04/24/23  8:41 AM  Result Value Ref Range   Preg Test, Ur NEGATIVE NEGATIVE    No results found.   Assessment & Plan:  1. Amenorrhea (Primary) Patient reports no periods x 6 months She desires pregnancy and has been attempting to get pregnancy UPT negative here Recommend return for full workup She verbalizes understanding and is agreeable   Approximately 15 minutes of total time was spent with this patient on history taking, coordination of care, education and documentation.   Conan Bowens, MD 04/24/2023 9:29 AM

## 2023-04-24 NOTE — Progress Notes (Signed)
 Possible Pregnancy  Here today for pregnancy confirmation. UPT in office today is negative. Has not had positive test at home but was hoping to be pregnant. LMP June 2024. Has had irregular periods in the past during middle school. Most recently periods were regular until 2023 following c-section when they began to space out from 1 month apart to 4 months apart. Recommended patient return for provider visit. Earlene Plater, MD to bedside for brief visit to welcome patient to practice.  Marjo Bicker, RN 04/24/2023  8:54 AM

## 2023-06-07 ENCOUNTER — Other Ambulatory Visit: Payer: Self-pay

## 2023-06-07 ENCOUNTER — Encounter: Payer: Self-pay | Admitting: Family Medicine

## 2023-06-07 ENCOUNTER — Ambulatory Visit: Admitting: Family Medicine

## 2023-06-07 ENCOUNTER — Other Ambulatory Visit (HOSPITAL_COMMUNITY)
Admission: RE | Admit: 2023-06-07 | Discharge: 2023-06-07 | Disposition: A | Discharge status: Home / Self Care | Source: Ambulatory Visit | Attending: Family Medicine | Admitting: Family Medicine

## 2023-06-07 VITALS — BP 125/84 | HR 73 | Wt 205.0 lb

## 2023-06-07 DIAGNOSIS — N926 Irregular menstruation, unspecified: Secondary | ICD-10-CM

## 2023-06-07 DIAGNOSIS — Z124 Encounter for screening for malignant neoplasm of cervix: Secondary | ICD-10-CM | POA: Diagnosis present

## 2023-06-07 DIAGNOSIS — E038 Other specified hypothyroidism: Secondary | ICD-10-CM

## 2023-06-07 MED ORDER — MEDROXYPROGESTERONE ACETATE 10 MG PO TABS: 10.0000 mg | ORAL_TABLET | Freq: Every day | ORAL | 2 refills | Status: DC

## 2023-06-07 NOTE — Assessment & Plan Note (Signed)
 Check labs. Suspect PCOS. Lengthy discussion had with pt. to explain PCOS, diagrams and verbal communication used to show impact on ovaries, endometrium, need for endometrial protection, impact on fertility, medical treatments to achieve necessary endpoints.  Should be on cyclical progestin vs. OC's if not trying to get pregnant.  Should be on Femara or clomid if trying to conceive. She will call back if weight loss does not have intended consequence of regulating cycles and desire for meds to help with fertility. She will work on weight loss. She will take cyclic provera . To use if goes longer than 1 month without menses. She will expect heavy cycle with first round of provera .

## 2023-06-07 NOTE — Progress Notes (Deleted)
   Subjective:    Patient ID: Robyn Ball is a 23 y.o. female presenting with No chief complaint on file.  on 06/07/2023  HPI: ***  Review of Systems    Objective:    BP 125/84   Pulse 73   Wt 205 lb (93 kg)   BMI 37.49 kg/m  Physical Exam      Assessment & Plan:   Total time in review of prior notes, pathology, labs, history taking, review with patient, exam, note writing, discussion of options, plan for next steps, alternatives and risks of treatment: *** minutes.  No follow-ups on file.  Granville Layer, MD 06/07/2023 8:59 AM

## 2023-06-07 NOTE — Progress Notes (Signed)
   Subjective:    Patient ID: Robyn Ball is a 23 y.o. female presenting with No chief complaint on file.  on 06/07/2023  HPI: Menarche at age 71. Cycles were light and lasted 1 week. Got heavier a few years later They were monthly. Then became more irregular. Has had times when she only had cycles 2x/year. Then was irregular to q 3 months. She then had some regular cycles in 2021 and 2022 after losing a lot of weight after COVID. Had a c-section I 2023. Cycles are more irregular since 2023 and her delivery.  Review of Systems  Constitutional:  Negative for chills and fever.  Respiratory:  Negative for shortness of breath.   Cardiovascular:  Negative for chest pain.  Gastrointestinal:  Negative for abdominal pain, nausea and vomiting.  Genitourinary:  Negative for dysuria.  Skin:  Negative for rash.      Objective:    BP 125/84   Pulse 73   Wt 205 lb (93 kg)   BMI 37.49 kg/m  Physical Exam Exam conducted with a chaperone present.  Constitutional:      General: She is not in acute distress.    Appearance: She is well-developed.  HENT:     Head: Normocephalic and atraumatic.  Eyes:     General: No scleral icterus. Cardiovascular:     Rate and Rhythm: Normal rate.  Pulmonary:     Effort: Pulmonary effort is normal.  Abdominal:     Palpations: Abdomen is soft.  Genitourinary:    Comments: BUS normal, vagina is pink and rugated, cervix is nulliparous without lesion  Musculoskeletal:     Cervical back: Neck supple.  Skin:    General: Skin is warm and dry.  Neurological:     Mental Status: She is alert and oriented to person, place, and time.         Assessment & Plan:   Problem List Items Addressed This Visit       Unprioritized   Irregular menses   Check labs. Suspect PCOS. Lengthy discussion had with pt. to explain PCOS, diagrams and verbal communication used to show impact on ovaries, endometrium, need for endometrial protection, impact on fertility,  medical treatments to achieve necessary endpoints.  Should be on cyclical progestin vs. OC's if not trying to get pregnant.  Should be on Femara or clomid if trying to conceive. She will call back if weight loss does not have intended consequence of regulating cycles and desire for meds to help with fertility. She will work on weight loss. She will take cyclic provera . To use if goes longer than 1 month without menses. She will expect heavy cycle with first round of provera .      Relevant Medications   medroxyPROGESTERone  (PROVERA ) 10 MG tablet   Other Relevant Orders   Follicle stimulating hormone   TSH   TestT+TestF+SHBG   Prolactin   LH   DHEA-sulfate   17-Hydroxyprogesterone   Other Visit Diagnoses       Encounter for Papanicolaou smear for cervical cancer screening    -  Primary   Relevant Orders   Cytology - PAP( [facility])        Return in about 6 months (around 12/08/2023) for a follow-up.  Granville Layer, MD 06/07/2023 9:06 AM

## 2023-06-10 LAB — FOLLICLE STIMULATING HORMONE: FSH: 4.7 m[IU]/mL

## 2023-06-10 LAB — TESTT+TESTF+SHBG
Sex Hormone Binding: 22.8 nmol/L — ABNORMAL LOW (ref 24.6–122.0)
Testosterone, Free: 1.1 pg/mL (ref 0.0–4.2)
Testosterone, Total, LC/MS: 55.9 ng/dL — ABNORMAL HIGH (ref 10.0–55.0)

## 2023-06-10 LAB — LUTEINIZING HORMONE: LH: 9 m[IU]/mL

## 2023-06-10 LAB — PROLACTIN: Prolactin: 12 ng/mL (ref 4.8–33.4)

## 2023-06-10 LAB — TSH: TSH: 11.3 u[IU]/mL — ABNORMAL HIGH (ref 0.450–4.500)

## 2023-06-10 LAB — 17-HYDROXYPROGESTERONE: 17-OH Progesterone LCMS: 68 ng/dL

## 2023-06-10 LAB — DHEA-SULFATE: DHEA-SO4: 125 ug/dL (ref 110.0–431.7)

## 2023-06-12 LAB — CYTOLOGY - PAP
Chlamydia: NEGATIVE
Comment: NEGATIVE
Comment: NEGATIVE
Comment: NEGATIVE
Comment: NEGATIVE
Comment: NEGATIVE
Comment: NORMAL
Diagnosis: UNDETERMINED — AB
HPV 16: NEGATIVE
HPV 18 / 45: NEGATIVE
High risk HPV: POSITIVE — AB
Neisseria Gonorrhea: NEGATIVE
Trichomonas: NEGATIVE

## 2023-06-12 NOTE — Progress Notes (Unsigned)
 Notified lab tech regarding add on per physician free T3 and free T4. Lab tech called [facility] and will send in fax.  Moira Andrews, RN

## 2023-06-12 NOTE — Progress Notes (Signed)
 Lab tech informed of physician's request, [facility] to place lab

## 2023-06-12 NOTE — Progress Notes (Signed)
 Lab tech notified of add on lab for free T3 and free T4. Lab tech to call Samantha Cress, RN

## 2023-06-13 ENCOUNTER — Encounter: Payer: Self-pay | Admitting: Family Medicine

## 2023-06-14 LAB — SPECIMEN STATUS REPORT

## 2023-06-14 LAB — T3, FREE: T3, Free: 3.9 pg/mL (ref 2.0–4.4)

## 2023-06-14 LAB — T4, FREE: Free T4: 0.71 ng/dL — ABNORMAL LOW (ref 0.82–1.77)

## 2023-06-15 MED ORDER — LEVOTHYROXINE SODIUM 50 MCG PO TABS: 50.0000 ug | ORAL_TABLET | Freq: Every day | ORAL | 1 refills | Status: DC

## 2023-06-15 NOTE — Addendum Note (Signed)
 Addended by: Granville Layer on: 06/15/2023 02:57 PM   Modules accepted: Orders

## 2023-06-22 ENCOUNTER — Emergency Department (HOSPITAL_BASED_OUTPATIENT_CLINIC_OR_DEPARTMENT_OTHER): Admission: EM | Admit: 2023-06-22 | Discharge: 2023-06-22 | Disposition: A | Discharge status: Home / Self Care

## 2023-06-22 ENCOUNTER — Other Ambulatory Visit: Payer: Self-pay

## 2023-06-22 DIAGNOSIS — J45909 Unspecified asthma, uncomplicated: Secondary | ICD-10-CM | POA: Insufficient documentation

## 2023-06-22 DIAGNOSIS — N939 Abnormal uterine and vaginal bleeding, unspecified: Secondary | ICD-10-CM | POA: Insufficient documentation

## 2023-06-22 DIAGNOSIS — R42 Dizziness and giddiness: Secondary | ICD-10-CM | POA: Insufficient documentation

## 2023-06-22 LAB — PREGNANCY, URINE: Preg Test, Ur: NEGATIVE

## 2023-06-22 LAB — CBC
HCT: 38.7 % (ref 36.0–46.0)
Hemoglobin: 13.2 g/dL (ref 12.0–15.0)
MCH: 29.9 pg (ref 26.0–34.0)
MCHC: 34.1 g/dL (ref 30.0–36.0)
MCV: 87.6 fL (ref 80.0–100.0)
Platelets: 201 10*3/uL (ref 150–400)
RBC: 4.42 MIL/uL (ref 3.87–5.11)
RDW: 13.1 % (ref 11.5–15.5)
WBC: 8.8 10*3/uL (ref 4.0–10.5)
nRBC: 0 % (ref 0.0–0.2)

## 2023-06-22 MED ORDER — IBUPROFEN 600 MG PO TABS: 600.0000 mg | ORAL_TABLET | Freq: Four times a day (QID) | ORAL | 0 refills | Status: AC

## 2023-06-22 NOTE — ED Triage Notes (Signed)
 Pt caox4, ambulatory c/o excessive menstrual bleeding x1 wk.

## 2023-06-22 NOTE — ED Provider Notes (Signed)
 [place] EMERGENCY DEPARTMENT AT [facility] Provider Note   CSN: [id number] Arrival date & time: 06/22/23  1530     History  Chief Complaint  Patient presents with   Vaginal Bleeding    Robyn Ball is a 23 y.o. female.   Vaginal Bleeding   23 year old female presents emergency department complaints of menstrual bleeding.  States that she has been having constant vaginal bleeding for the past week or so.  States that she has been going through about 10 pads a day.  States that she began to feel lightheaded/dizzy over the past couple of days prompting visit to the emergency department given concern for possible anemia.  States that prior to this, did not have a menstrual cycle since June.  Was followed with OB/GYN in the outpatient setting who began Provera  and got screening labs concern for possible PCOS as cause of her amenorrhea.  Patient states that she took the medication which was successful invoking menstrual cycle.  States that she stopped taking the medicine on Monday but has had continued bleeding.  States she also was told that her thyroid  testing was abnormal and wanted to ask about that as well.  Denies any fevers, chills, chest pain, shortness of breath, nausea, vomiting, urinary symptoms, vaginal discharge.  Patient does report lower middle cramping abdominal pain with some radiation to the back consistent with prior menstrual cycles.  Past medical history significant for asthma, MDD  Home Medications Prior to Admission medications   Medication Sig Start Date End Date Taking? Authorizing Provider  ibuprofen  (ADVIL ) 600 MG tablet Take 1 tablet (600 mg total) by mouth 4 (four) times daily for 7 days. 06/22/23 06/29/23 Yes [place] Butter, PA  albuterol  (VENTOLIN  HFA) 108 (90 Base) MCG/ACT inhaler Inhale 1-2 puffs into the lungs every 6 (six) hours as needed for wheezing or shortness of breath. Patient not taking: Reported on 04/24/2023 04/06/23   Harlow Lighter,  [place]  N, FNP  levothyroxine (SYNTHROID) 50 MCG tablet Take 1 tablet (50 mcg total) by mouth daily before breakfast. 06/15/23   [place] Layer, MD  medroxyPROGESTERone  (PROVERA ) 10 MG tablet Take 1 tablet (10 mg total) by mouth daily. 06/07/23   [place] Layer, MD  Prenatal Vit-Fe Fumarate-FA (PREPLUS) 27-1 MG TABS Take 1 tablet by mouth daily.    [provider]      Allergies    Pollen extract    Review of Systems   Review of Systems  Genitourinary:  Positive for vaginal bleeding.  All other systems reviewed and are negative.   Physical Exam Updated Vital Signs BP 127/82 (BP Location: Right Arm)   Pulse 78   Temp 98 F (36.7 C) (Oral)   Resp 18   Ht 5\' 2"  (1.575 m)   Wt 93 kg   LMP 06/17/2023 (Exact Date)   SpO2 100%   BMI 37.49 kg/m  Physical Exam Vitals and nursing note reviewed.  Constitutional:      General: She is not in acute distress.    Appearance: She is well-developed.  HENT:     Head: Normocephalic and atraumatic.  Eyes:     Conjunctiva/sclera: Conjunctivae normal.  Cardiovascular:     Rate and Rhythm: Normal rate and regular rhythm.     Heart sounds: No murmur heard. Pulmonary:     Effort: Pulmonary effort is normal. No respiratory distress.     Breath sounds: Normal breath sounds.  Abdominal:     Palpations: Abdomen is soft.  Tenderness: There is no abdominal tenderness.  Musculoskeletal:        General: No swelling.     Cervical back: Neck supple.  Skin:    General: Skin is warm and dry.     Capillary Refill: Capillary refill takes less than 2 seconds.  Neurological:     Mental Status: She is alert.  Psychiatric:        Mood and Affect: Mood normal.     ED Results / Procedures / Treatments   Labs (all labs ordered are listed, but only abnormal results are displayed) Labs Reviewed  PREGNANCY, URINE  CBC    EKG None  Radiology No results found.  Procedures Procedures    Medications Ordered in ED Medications - No data  to display  ED Course/ Medical Decision Making/ A&P                                 Medical Decision Making Amount and/or Complexity of Data Reviewed Labs: ordered.  Risk Prescription drug management.   This patient presents to the ED for concern of vaginal bleeding, this involves an extensive number of treatment options, and is a complaint that carries with it a high risk of complications and morbidity.  The differential diagnosis includes polyp, adenomyoma, leiomyoma, ovarian dysfunction, coagulopathy, endometrial hyperplasia, malignancy, other   Co morbidities that complicate the patient evaluation  See HPI   Additional history obtained:  Additional history obtained from EMR External records from outside source obtained and reviewed including hospital records   Lab Tests:  I Ordered, and personally interpreted labs.  The pertinent results include: No leukocytosis.  No evidence of anemia.  Platelets within range.  Urine pregnancy negative.   Imaging Studies ordered:  N/a   Cardiac Monitoring: / EKG:  The patient was maintained on a cardiac monitor.  I personally viewed and interpreted the cardiac monitored which showed an underlying rhythm of: sinus rhythm   Consultations Obtained:  N/a   Problem List / ED Course / Critical interventions / Medication management  Vaginal bleeding, lightheadedness Reevaluation of the patient showed that the patient stayed the same I have reviewed the patients home medicines and have made adjustments as needed   Social Determinants of Health:  Denies tobacco, illicit drug use.   Test / Admission - Considered:  Vaginal bleeding, lightheadedness Vitals signs within normal range and stable throughout visit. Laboratory studies significant for: See above 23 year old female presents emergency department complaints of menstrual bleeding.  States that she has been having constant vaginal bleeding for the past week or so.   States that she has been going through about 10 pads a day.  States that she began to feel lightheaded/dizzy over the past couple of days prompting visit to the emergency department given concern for possible anemia.  States that prior to this, did not have a menstrual cycle since June.  Was followed with OB/GYN in the outpatient setting who began Provera  and got screening labs concern for possible PCOS as cause of her amenorrhea.  Patient states that she took the medication which was successful invoking menstrual cycle.  States that she stopped taking the medicine on Monday but has had continued bleeding.  States she also was told that her thyroid  testing was abnormal and wanted to ask about that as well.  Denies any fevers, chills, chest pain, shortness of breath, nausea, vomiting, urinary symptoms, vaginal discharge.  Patient does report  lower middle cramping abdominal pain with some radiation to the back consistent with prior menstrual cycles. On exam, minimal tenderness suprapubic/pelvic region; no adnexal/lateral abdominal tenderness.  Labs reassuring with normal hemoglobin.  Regarding vaginal bleeding, seems to be provoked by [facility] for which patient has since stopped.  Shared decision making conversation was had with patient and she did not seem interested in starting a new medication to stop her menstrual cycle as she just started after nearly 12 months of not having 1.  Was reassured that her hemoglobin was normal.  States she will follow-up with her OB/GYN for reassessment.  Will send in 60 mg ibuprofen  to take if she changes her mind regarding education to try to get her menstrual cycle to stop.  We talked about TXA as well which she did not seem interested in at this time.  Regarding patient's hypothyroidism, did have TSH that was elevated with T4 that was low in the laboratory results tab; levothyroxine was sent into the pharmacy about a week ago but patient has not picked up prescription.  Will  recommend beginning this medication.  Treatment plan discussed with patient and she acknowledged understanding was agreeable to said plan.  Patient overall well-appearing, afebrile in no acute distress. Worrisome signs and symptoms were discussed with the patient, and the patient acknowledged understanding to return to the ED if noticed. Patient was stable upon discharge.          Final Clinical Impression(s) / ED Diagnoses Final diagnoses:  None    Rx / DC Orders ED Discharge Orders          Ordered    ibuprofen  (ADVIL ) 600 MG tablet  4 times daily        06/22/23 1711              [place] Butter, [place] 06/22/23 1725    Rolinda Climes, DO 06/22/23 2308

## 2023-06-22 NOTE — Discharge Instructions (Addendum)
 As discussed, your workup today was overall reassuring.  Your blood cell count appears normal so your body seems to be keeping up with the blood you are losing.  Will send a medicine called ibuprofen  to take if your bleeding persists or get worse.  Recommend picking up the levothyroxine at the pharmacy as you are diagnosed with low thyroid  or hypothyroidism by your primary care.  Please do not hesitate to return to the emergency department if the worrisome signs and symptoms we discussed become apparent.

## 2023-09-26 ENCOUNTER — Other Ambulatory Visit: Payer: Self-pay

## 2023-09-26 ENCOUNTER — Encounter (HOSPITAL_COMMUNITY): Payer: Self-pay

## 2023-09-26 ENCOUNTER — Emergency Department (HOSPITAL_COMMUNITY)
Admission: EM | Admit: 2023-09-26 | Discharge: 2023-09-27 | Disposition: A | Discharge status: Home / Self Care | Attending: Emergency Medicine | Admitting: Emergency Medicine

## 2023-09-26 DIAGNOSIS — R42 Dizziness and giddiness: Secondary | ICD-10-CM | POA: Diagnosis present

## 2023-09-26 DIAGNOSIS — D72829 Elevated white blood cell count, unspecified: Secondary | ICD-10-CM | POA: Diagnosis not present

## 2023-09-26 DIAGNOSIS — R55 Syncope and collapse: Secondary | ICD-10-CM | POA: Insufficient documentation

## 2023-09-26 LAB — CBC
HCT: 41.2 % (ref 36.0–46.0)
Hemoglobin: 13.6 g/dL (ref 12.0–15.0)
MCH: 28.8 pg (ref 26.0–34.0)
MCHC: 33 g/dL (ref 30.0–36.0)
MCV: 87.1 fL (ref 80.0–100.0)
Platelets: 210 K/uL (ref 150–400)
RBC: 4.73 MIL/uL (ref 3.87–5.11)
RDW: 12.6 % (ref 11.5–15.5)
WBC: 11.2 K/uL — ABNORMAL HIGH (ref 4.0–10.5)
nRBC: 0 % (ref 0.0–0.2)

## 2023-09-26 LAB — COMPREHENSIVE METABOLIC PANEL WITH GFR
ALT: 35 U/L (ref 0–44)
AST: 25 U/L (ref 15–41)
Albumin: 4 g/dL (ref 3.5–5.0)
Alkaline Phosphatase: 78 U/L (ref 38–126)
Anion gap: 8 (ref 5–15)
BUN: 10 mg/dL (ref 6–20)
CO2: 24 mmol/L (ref 22–32)
Calcium: 9.2 mg/dL (ref 8.9–10.3)
Chloride: 105 mmol/L (ref 98–111)
Creatinine, Ser: 0.73 mg/dL (ref 0.44–1.00)
GFR, Estimated: >60 mL/min (ref >60)
Glucose, Bld: 89 mg/dL (ref 70–99)
Potassium: 3.6 mmol/L (ref 3.5–5.1)
Sodium: 137 mmol/L (ref 135–145)
Total Bilirubin: 0.6 mg/dL (ref 0.0–1.2)
Total Protein: 7.5 g/dL (ref 6.5–8.1)

## 2023-09-26 LAB — HCG, SERUM, QUALITATIVE: Preg, Serum: NEGATIVE

## 2023-09-26 NOTE — ED Triage Notes (Addendum)
 Pt is employee, was working, started feeling dizzy and passed out. Staff caught pt, pt states she has only had an oat smoothie today. States she passed out before when she was pregnant. Pt feels dizzy. CBG 89

## 2023-09-26 NOTE — ED Provider Notes (Signed)
 [facility] Emergency Department Provider Note MRN:  [id number]  Arrival date & time: 09/27/23     Chief Complaint   Loss of Consciousness   History of Present Illness   Robyn Ball is a 23 y.o. year-old female presents to the ED with chief complaint of feeling tired.  Reports near syncope.  States that she hasn't been sleeping well.  States that she has only been sleeping about 4-5 hours per night.  States that she hasn't eaten anything today.  States that she became dizzy and felt like she was going to pass out at work..  History provided by patient.   Review of Systems  Pertinent positive and negative review of systems noted in HPI.    Physical Exam   Vitals:   09/26/23 2346 09/26/23 2354  BP: 115/66   Pulse: 68   Resp: 16   Temp:  97.9 F (36.6 C)  SpO2: 97%     CONSTITUTIONAL:  non toxic-appearing, NAD NEURO:  Alert and oriented x 3, CN 3-12 grossly intact EYES:  eyes equal and reactive ENT/NECK:  Supple, no stridor  CARDIO:  normal rate, regular rhythm, appears well-perfused  PULM:  No respiratory distress, CTAB GI/GU:  non-distended, no focal tederness MSK/SPINE:  No gross deformities, no edema, moves all extremities  SKIN:  no rash, atraumatic   *Additional and/or pertinent findings included in MDM below  Diagnostic and Interventional Summary    EKG Interpretation Date/Time:    Ventricular Rate:    PR Interval:    QRS Duration:    QT Interval:    QTC Calculation:   R Axis:      Text Interpretation:         Labs Reviewed  CBC - Abnormal; Notable for the following components:      Result Value   WBC 11.2 (*)    All other components within normal limits  COMPREHENSIVE METABOLIC PANEL WITH GFR  HCG, SERUM, QUALITATIVE  CBG MONITORING, ED    No orders to display    Medications - No data to display   Procedures  /  Critical Care Procedures  ED Course and Medical Decision Making  I have reviewed the  triage vital signs, the nursing notes, and pertinent available records from the EMR.  Social Determinants Affecting Complexity of Care: Patient has no clinically significant social determinants affecting this chief complaint..   ED Course: Clinical Course as of 09/27/23 0002  Thu Sep 27, 2023  0001 EKG 12-Lead NSR without arrhythmia [RB]  0001 hCG, serum, qualitative Pregnancy test negative [RB]  0001 Comprehensive metabolic panel No significant electrolyte derangement [RB]  0001 CBC(!) Nonspecific leukocytosis.  No anemia. [RB]    Clinical Course User Index [RB] Vicky Charleston, PA-C    Medical Decision Making Patient here with near syncope.  States that she felt lightheaded while at work and had to sit down.  She states that she has not been sleeping well and that she also did not have anything to eat today.  She states that it was at the end of her shift when she felt lightheaded.  Her workup is reassuring.  Labs and EKG discussed and ED course.  She is not orthostatic.  She feels well now.  She denies any chest pain or shortness of breath.  Doubt PE.  Pregnancy test negative.  Hemoglobin.  At this time, I do not think the patient requires any further emergent workup.  Will plan for discharge home.  Give her  a note for work so that she can get some rest.  I have educated the patient on getting proper rest, eating right, and exercising.  Recommend PCP follow-up.  Return precautions discussed.  Amount and/or Complexity of Data Reviewed Labs: ordered. Decision-making details documented in ED Course. ECG/medicine tests:  Decision-making details documented in ED Course.         Consultants: No consultations were needed in caring for this patient.   Treatment and Plan: I considered admission due to patient's initial presentation, but after considering the examination and diagnostic results, patient will not require admission and can be discharged with outpatient  follow-up.    Final Clinical Impressions(s) / ED Diagnoses     ICD-10-CM   1. Near syncope  R55       ED Discharge Orders     None         Discharge Instructions Discussed with and Provided to Patient:   Discharge Instructions   None      Vicky Charleston, PA-C 09/27/23 0002    Theadore Ozell HERO, MD 09/27/23 [phone]

## 2023-12-06 ENCOUNTER — Other Ambulatory Visit: Payer: Self-pay | Admitting: Family Medicine

## 2023-12-06 DIAGNOSIS — E038 Other specified hypothyroidism: Secondary | ICD-10-CM

## 2024-01-29 ENCOUNTER — Ambulatory Visit: Admission: EM | Admit: 2024-01-29 | Discharge: 2024-01-29 | Disposition: A | Discharge status: Home / Self Care

## 2024-01-29 ENCOUNTER — Encounter: Payer: Self-pay | Admitting: Emergency Medicine

## 2024-01-29 DIAGNOSIS — N912 Amenorrhea, unspecified: Secondary | ICD-10-CM

## 2024-01-29 DIAGNOSIS — J209 Acute bronchitis, unspecified: Secondary | ICD-10-CM

## 2024-01-29 LAB — POCT URINE PREGNANCY: Preg Test, Ur: NEGATIVE

## 2024-01-29 MED ORDER — PREDNISONE 50 MG PO TABS: ORAL_TABLET | ORAL | 0 refills | Status: AC

## 2024-01-29 MED ORDER — BENZONATATE 100 MG PO CAPS: 100.0000 mg | ORAL_CAPSULE | Freq: Three times a day (TID) | ORAL | 0 refills | Status: AC

## 2024-01-29 MED ORDER — GUAIFENESIN ER 600 MG PO TB12: 600.0000 mg | ORAL_TABLET | Freq: Two times a day (BID) | ORAL | 0 refills | Status: AC

## 2024-01-29 NOTE — ED Triage Notes (Signed)
 Patient was coughing up mucous and then started vomiting .  Patient stated she felt something drop in her abdominal area.  Everything started today

## 2024-01-30 NOTE — ED Provider Notes (Signed)
 " [name]    CSN: [id number] Arrival date & time: 01/29/24  1848      History   Chief Complaint Chief Complaint  Patient presents with   Cough   Emesis    HPI Robyn Ball is a 23 y.o. female.   Pt presents today due to cough productive of yellow greenish sputum for the past couple of days. Pt states that she has attempted to use Robitussin for symptoms without relief. Pt denies fever, chills, nausea, or known sick contacts. 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 Pt states that she also felt abdominal pain earlier today, pt denies changes in urinary habits, LMP was September.  The history is provided by the patient.  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 Authorizing Provider  benzonatate  (TESSALON ) 100 MG capsule Take 1 capsule (100 mg total) by mouth every 8 (eight) hours. 01/29/24  Yes Andra Corean BROCKS, PA-C  guaiFENesin  (MUCINEX ) 600 MG 12 hr tablet Take 1 tablet (600 mg total) by mouth 2 (two) times daily for 10 days. 01/29/24 02/08/24 Yes Andra Corean BROCKS, PA-C  predniSONE  (DELTASONE ) 50 MG tablet Take 1 tab po daily for 5 days 01/29/24   Yes Andra Corean C, PA-C  Vitamin D, Ergocalciferol, (DRISDOL) 1.25 MG (50000 UNIT) CAPS capsule Take 50,000 Units by mouth once a week. 12/30/23  Yes [provider]  albuterol  (VENTOLIN  HFA) 108 (90 Base) MCG/ACT inhaler Inhale 1-2 puffs into the lungs every 6 (six) hours as needed for wheezing or shortness of breath. Patient not taking: Reported on 04/24/2023 04/06/23   [place], [place]  N, FNP    Family History Family History  Problem Relation Age of Onset   Diabetes Father     Social History Social History[1]   Allergies   Pollen extract   Review of Systems Review of Systems  Respiratory:  Positive for cough.   Gastrointestinal:  Positive for vomiting.     Physical Exam Triage Vital Signs ED Triage Vitals  Encounter Vitals Group     BP 01/29/24 1944 115/78     Girls Systolic BP Percentile --      Girls Diastolic BP Percentile --      Boys Systolic BP Percentile --      Boys Diastolic BP Percentile --      Pulse Rate 01/29/24 1944 74  Resp 01/29/24 1944 16     Temp 01/29/24 1944 98.2 F (36.8 C)     Temp Source 01/29/24 1944 Oral     SpO2 01/29/24 1944 94 %     Weight --      Height --      Head Circumference --      Peak Flow --      Pain Score 01/29/24 1942 0     Pain Loc --      Pain Education --      Exclude from Growth Chart --    No data found.  Updated Vital Signs BP 115/78 (BP Location: Left Arm)   Pulse 74   Temp 98.2 F (36.8 C) (Oral)   Resp 16   LMP 10/09/2023 (Exact Date)   SpO2 94%   Breastfeeding No   Visual Acuity Right Eye Distance:   Left Eye Distance:   Bilateral Distance:    Right Eye Near:   Left Eye Near:    Bilateral Near:     Physical Exam Vitals and nursing note reviewed.  Constitutional:      General: She is not in acute distress.    Appearance: Normal appearance. She is not ill-appearing, toxic-appearing or diaphoretic.  Eyes:     General: No scleral icterus. Cardiovascular:     Rate and  Rhythm: Normal rate and regular rhythm.     Heart sounds: Normal heart sounds.  Pulmonary:     Effort: Pulmonary effort is normal. No respiratory distress.     Breath sounds: Wheezing present. No rhonchi.  Skin:    General: Skin is warm.  Neurological:     Mental Status: She is alert and oriented to person, place, and time.  Psychiatric:        Mood and Affect: Mood normal.        Behavior: Behavior normal.      UC Treatments / Results  Labs (all labs ordered are listed, but only abnormal results are displayed) Labs Reviewed  POCT URINE PREGNANCY - Normal    EKG   Radiology No results found.  Procedures Procedures (including critical care time)  Medications Ordered in UC Medications - No data to display  Initial Impression / Assessment and Plan / UC Course  I have reviewed the triage vital signs and the nursing notes.  Pertinent labs & imaging results that were available during my care of the patient were reviewed by me and considered in my medical decision making (see chart for details).      Final Clinical Impressions(s) / UC Diagnoses   Final diagnoses:  Amenorrhea  Acute bronchitis, unspecified organism   Discharge Instructions   None    ED Prescriptions     Medication Sig Dispense Auth. Provider   benzonatate  (TESSALON ) 100 MG capsule Take 1 capsule (100 mg total) by mouth every 8 (eight) hours. 30 capsule Andra Corean BROCKS, PA-C   guaiFENesin  (MUCINEX ) 600 MG 12 hr tablet Take 1 tablet (600 mg total) by mouth 2 (two) times daily for 10 days. 20 tablet Javontae Marlette C, PA-C   predniSONE  (DELTASONE ) 50 MG tablet Take 1 tab po daily for 5 days 5 tablet Andra Corean BROCKS, PA-C      PDMP not reviewed this encounter.    [1]  Social History Tobacco Use   Smoking status: Never   Smokeless tobacco: Never  Vaping Use   Vaping status: Never Used  Substance Use Topics   Alcohol use: No   Drug  use: No     Andra Corean BROCKS,  [place] 01/30/24 [id number]  "
